# Patient Record
Sex: Female | Born: 1935 | Race: White | Hispanic: No | Smoking: Never smoker
Health system: Southern US, Community
[De-identification: ages and names within clinical notes are randomized; demographics above are authoritative.]

## PROBLEM LIST (undated history)

## (undated) DIAGNOSIS — I10 Essential (primary) hypertension: Secondary | ICD-10-CM

## (undated) DIAGNOSIS — L989 Disorder of the skin and subcutaneous tissue, unspecified: Secondary | ICD-10-CM

## (undated) DIAGNOSIS — F03918 Unspecified dementia, unspecified severity, with other behavioral disturbance: Secondary | ICD-10-CM

## (undated) DIAGNOSIS — I340 Nonrheumatic mitral (valve) insufficiency: Secondary | ICD-10-CM

## (undated) DIAGNOSIS — H00013 Hordeolum externum right eye, unspecified eyelid: Secondary | ICD-10-CM

## (undated) DIAGNOSIS — F418 Other specified anxiety disorders: Secondary | ICD-10-CM

## (undated) DIAGNOSIS — I341 Nonrheumatic mitral (valve) prolapse: Secondary | ICD-10-CM

## (undated) DIAGNOSIS — R443 Hallucinations, unspecified: Secondary | ICD-10-CM

## (undated) DIAGNOSIS — S81801D Unspecified open wound, right lower leg, subsequent encounter: Secondary | ICD-10-CM

## (undated) DIAGNOSIS — R350 Frequency of micturition: Secondary | ICD-10-CM

## (undated) DIAGNOSIS — R519 Headache, unspecified: Secondary | ICD-10-CM

## (undated) DIAGNOSIS — L03116 Cellulitis of left lower limb: Secondary | ICD-10-CM

## (undated) DIAGNOSIS — R51 Headache: Secondary | ICD-10-CM

## (undated) DIAGNOSIS — L509 Urticaria, unspecified: Secondary | ICD-10-CM

## (undated) DIAGNOSIS — L97921 Non-pressure chronic ulcer of unspecified part of left lower leg limited to breakdown of skin: Secondary | ICD-10-CM

## (undated) DIAGNOSIS — R195 Other fecal abnormalities: Secondary | ICD-10-CM

## (undated) DIAGNOSIS — K219 Gastro-esophageal reflux disease without esophagitis: Secondary | ICD-10-CM

## (undated) DIAGNOSIS — S81801S Unspecified open wound, right lower leg, sequela: Secondary | ICD-10-CM

## (undated) DIAGNOSIS — N182 Chronic kidney disease, stage 2 (mild): Secondary | ICD-10-CM

## (undated) DIAGNOSIS — I2510 Atherosclerotic heart disease of native coronary artery without angina pectoris: Secondary | ICD-10-CM

## (undated) DIAGNOSIS — R42 Dizziness and giddiness: Secondary | ICD-10-CM

## (undated) DIAGNOSIS — E785 Hyperlipidemia, unspecified: Secondary | ICD-10-CM

## (undated) DIAGNOSIS — L603 Nail dystrophy: Secondary | ICD-10-CM

## (undated) DIAGNOSIS — L0390 Cellulitis, unspecified: Secondary | ICD-10-CM

## (undated) DIAGNOSIS — F0391 Unspecified dementia with behavioral disturbance: Secondary | ICD-10-CM

## (undated) DIAGNOSIS — M8580 Other specified disorders of bone density and structure, unspecified site: Secondary | ICD-10-CM

## (undated) HISTORY — DX: Chronic kidney disease, stage 2 (mild): N18.2

## (undated) HISTORY — DX: Headache: R51

## (undated) HISTORY — DX: Frequency of micturition: R35.0

## (undated) HISTORY — DX: Unspecified dementia with behavioral disturbance: F03.91

## (undated) HISTORY — DX: Unspecified open wound, right lower leg, sequela: S81.801S

## (undated) HISTORY — PX: APPENDECTOMY: SHX54

## (undated) HISTORY — DX: Unspecified open wound, right lower leg, subsequent encounter: S81.801D

## (undated) HISTORY — DX: Nail dystrophy: L60.3

## (undated) HISTORY — DX: Essential (primary) hypertension: I10

## (undated) HISTORY — DX: Unspecified dementia, unspecified severity, with other behavioral disturbance: F03.918

## (undated) HISTORY — DX: Nonrheumatic mitral (valve) insufficiency: I34.0

## (undated) HISTORY — PX: CHOLECYSTECTOMY: SHX55

## (undated) HISTORY — DX: Gastro-esophageal reflux disease without esophagitis: K21.9

## (undated) HISTORY — DX: Other specified anxiety disorders: F41.8

## (undated) HISTORY — DX: Dizziness and giddiness: R42

## (undated) HISTORY — DX: Hyperlipidemia, unspecified: E78.5

## (undated) HISTORY — DX: Urticaria, unspecified: L50.9

## (undated) HISTORY — DX: Headache, unspecified: R51.9

## (undated) HISTORY — PX: TONSILLECTOMY: SUR1361

## (undated) HISTORY — DX: Non-pressure chronic ulcer of unspecified part of left lower leg limited to breakdown of skin: L97.921

## (undated) HISTORY — PX: CATARACT EXTRACTION, BILATERAL: SHX1313

## (undated) HISTORY — DX: Nonrheumatic mitral (valve) prolapse: I34.1

## (undated) HISTORY — DX: Cellulitis of left lower limb: L03.116

## (undated) HISTORY — DX: Hallucinations, unspecified: R44.3

## (undated) HISTORY — DX: Atherosclerotic heart disease of native coronary artery without angina pectoris: I25.10

## (undated) HISTORY — DX: Other fecal abnormalities: R19.5

## (undated) HISTORY — DX: Hordeolum externum right eye, unspecified eyelid: H00.013

## (undated) HISTORY — DX: Disorder of the skin and subcutaneous tissue, unspecified: L98.9

## (undated) HISTORY — DX: Cellulitis, unspecified: L03.90

## (undated) HISTORY — DX: Other specified disorders of bone density and structure, unspecified site: M85.80

---

## 1997-06-23 ENCOUNTER — Other Ambulatory Visit: Admission: RE | Admit: 1997-06-23 | Discharge: 1997-06-23 | Payer: Self-pay | Admitting: Family Medicine

## 1997-08-22 ENCOUNTER — Other Ambulatory Visit: Admission: RE | Admit: 1997-08-22 | Discharge: 1997-08-22 | Payer: Self-pay | Admitting: Gynecology

## 1998-08-08 ENCOUNTER — Other Ambulatory Visit: Admission: RE | Admit: 1998-08-08 | Discharge: 1998-08-08 | Payer: Self-pay | Admitting: Family Medicine

## 1999-09-04 ENCOUNTER — Other Ambulatory Visit: Admission: RE | Admit: 1999-09-04 | Discharge: 1999-09-04 | Payer: Self-pay | Admitting: Family Medicine

## 1999-09-16 ENCOUNTER — Encounter: Payer: Self-pay | Admitting: Family Medicine

## 1999-09-16 ENCOUNTER — Encounter: Admission: RE | Admit: 1999-09-16 | Discharge: 1999-09-16 | Payer: Self-pay | Admitting: Family Medicine

## 1999-11-21 ENCOUNTER — Encounter: Payer: Self-pay | Admitting: Surgery

## 1999-11-22 ENCOUNTER — Encounter (INDEPENDENT_AMBULATORY_CARE_PROVIDER_SITE_OTHER): Payer: Self-pay | Admitting: Specialist

## 1999-11-22 ENCOUNTER — Observation Stay (HOSPITAL_COMMUNITY): Admission: RE | Admit: 1999-11-22 | Discharge: 1999-11-23 | Payer: Self-pay | Admitting: Surgery

## 2000-01-28 ENCOUNTER — Emergency Department (HOSPITAL_COMMUNITY): Admission: EM | Admit: 2000-01-28 | Discharge: 2000-01-29 | Payer: Self-pay | Admitting: Emergency Medicine

## 2000-01-28 ENCOUNTER — Encounter: Payer: Self-pay | Admitting: Emergency Medicine

## 2000-09-16 ENCOUNTER — Encounter: Payer: Self-pay | Admitting: Family Medicine

## 2000-09-16 ENCOUNTER — Encounter: Admission: RE | Admit: 2000-09-16 | Discharge: 2000-09-16 | Payer: Self-pay | Admitting: Family Medicine

## 2000-10-13 ENCOUNTER — Other Ambulatory Visit: Admission: RE | Admit: 2000-10-13 | Discharge: 2000-10-13 | Payer: Self-pay | Admitting: Family Medicine

## 2000-11-25 ENCOUNTER — Ambulatory Visit (HOSPITAL_COMMUNITY): Admission: RE | Admit: 2000-11-25 | Discharge: 2000-11-25 | Payer: Self-pay | Admitting: Gastroenterology

## 2000-11-25 ENCOUNTER — Encounter (INDEPENDENT_AMBULATORY_CARE_PROVIDER_SITE_OTHER): Payer: Self-pay | Admitting: Specialist

## 2001-08-31 ENCOUNTER — Encounter: Payer: Self-pay | Admitting: Family Medicine

## 2001-08-31 ENCOUNTER — Encounter: Admission: RE | Admit: 2001-08-31 | Discharge: 2001-08-31 | Payer: Self-pay | Admitting: Family Medicine

## 2001-10-07 ENCOUNTER — Other Ambulatory Visit: Admission: RE | Admit: 2001-10-07 | Discharge: 2001-10-07 | Payer: Self-pay | Admitting: Family Medicine

## 2002-06-17 ENCOUNTER — Encounter: Admission: RE | Admit: 2002-06-17 | Discharge: 2002-06-17 | Payer: Self-pay | Admitting: Family Medicine

## 2002-06-17 ENCOUNTER — Encounter: Payer: Self-pay | Admitting: Family Medicine

## 2002-09-07 ENCOUNTER — Encounter: Payer: Self-pay | Admitting: Family Medicine

## 2002-09-07 ENCOUNTER — Encounter: Admission: RE | Admit: 2002-09-07 | Discharge: 2002-09-07 | Payer: Self-pay | Admitting: Family Medicine

## 2003-09-11 ENCOUNTER — Encounter: Admission: RE | Admit: 2003-09-11 | Discharge: 2003-09-11 | Payer: Self-pay | Admitting: Family Medicine

## 2004-07-31 ENCOUNTER — Encounter: Admission: RE | Admit: 2004-07-31 | Discharge: 2004-07-31 | Payer: Self-pay | Admitting: Orthopedic Surgery

## 2004-08-01 ENCOUNTER — Ambulatory Visit (HOSPITAL_BASED_OUTPATIENT_CLINIC_OR_DEPARTMENT_OTHER): Admission: RE | Admit: 2004-08-01 | Discharge: 2004-08-01 | Payer: Self-pay | Admitting: Orthopedic Surgery

## 2004-08-01 ENCOUNTER — Encounter (INDEPENDENT_AMBULATORY_CARE_PROVIDER_SITE_OTHER): Payer: Self-pay | Admitting: *Deleted

## 2004-08-01 ENCOUNTER — Ambulatory Visit (HOSPITAL_COMMUNITY): Admission: RE | Admit: 2004-08-01 | Discharge: 2004-08-01 | Payer: Self-pay | Admitting: Orthopedic Surgery

## 2004-09-03 ENCOUNTER — Other Ambulatory Visit: Admission: RE | Admit: 2004-09-03 | Discharge: 2004-09-03 | Payer: Self-pay | Admitting: Family Medicine

## 2004-09-24 ENCOUNTER — Encounter: Admission: RE | Admit: 2004-09-24 | Discharge: 2004-09-24 | Payer: Self-pay | Admitting: Family Medicine

## 2005-09-30 ENCOUNTER — Encounter: Admission: RE | Admit: 2005-09-30 | Discharge: 2005-09-30 | Payer: Self-pay | Admitting: Family Medicine

## 2005-10-15 ENCOUNTER — Observation Stay (HOSPITAL_COMMUNITY): Admission: EM | Admit: 2005-10-15 | Discharge: 2005-10-16 | Payer: Self-pay | Admitting: Emergency Medicine

## 2005-10-16 HISTORY — PX: CARDIAC CATHETERIZATION: SHX172

## 2006-08-05 ENCOUNTER — Other Ambulatory Visit: Admission: RE | Admit: 2006-08-05 | Discharge: 2006-08-05 | Payer: Self-pay | Admitting: Gynecology

## 2006-09-26 ENCOUNTER — Emergency Department (HOSPITAL_COMMUNITY): Admission: EM | Admit: 2006-09-26 | Discharge: 2006-09-26 | Payer: Self-pay | Admitting: Emergency Medicine

## 2006-10-21 ENCOUNTER — Encounter: Admission: RE | Admit: 2006-10-21 | Discharge: 2006-10-21 | Payer: Self-pay | Admitting: Gynecology

## 2007-05-25 DIAGNOSIS — F418 Other specified anxiety disorders: Secondary | ICD-10-CM

## 2007-05-25 HISTORY — DX: Other specified anxiety disorders: F41.8

## 2007-10-22 ENCOUNTER — Encounter: Admission: RE | Admit: 2007-10-22 | Discharge: 2007-10-22 | Payer: Self-pay | Admitting: Family Medicine

## 2008-06-14 ENCOUNTER — Encounter: Admission: RE | Admit: 2008-06-14 | Discharge: 2008-06-14 | Payer: Self-pay | Admitting: Cardiology

## 2008-06-16 ENCOUNTER — Ambulatory Visit (HOSPITAL_COMMUNITY): Admission: RE | Admit: 2008-06-16 | Discharge: 2008-06-16 | Payer: Self-pay | Admitting: Cardiology

## 2008-06-16 HISTORY — PX: CARDIAC CATHETERIZATION: SHX172

## 2008-10-23 ENCOUNTER — Encounter: Admission: RE | Admit: 2008-10-23 | Discharge: 2008-10-23 | Payer: Self-pay | Admitting: Gynecology

## 2009-10-24 ENCOUNTER — Encounter: Admission: RE | Admit: 2009-10-24 | Discharge: 2009-10-24 | Payer: Self-pay | Admitting: Gynecology

## 2010-01-15 ENCOUNTER — Encounter
Admission: RE | Admit: 2010-01-15 | Discharge: 2010-02-19 | Payer: Self-pay | Source: Home / Self Care | Attending: Orthopedic Surgery | Admitting: Orthopedic Surgery

## 2010-06-04 NOTE — Cardiovascular Report (Signed)
NAMEGENIFER, LAZENBY NO.:  [id number]   MEDICAL RECORD NO.:  [id number]          PATIENT TYPE:  OIB   LOCATION:  2899                         FACILITY:  [facility]   PHYSICIAN:  Thereasa Solo. Little, M.D. DATE OF BIRTH:  06-03-1926   DATE OF PROCEDURE:  06/16/2008  DATE OF DISCHARGE:  06/16/2008                            CARDIAC CATHETERIZATION   INDICATIONS FOR TEST:  This 77 year old female had remotely had a  cardiac catheterization in 2007 that was predominantly normal.  She  presented to my office on Jun 14, 2008 with complaints of some chest  discomfort.  She describes as a pressure sensation and a pounding in her  chest.  She is brought to the Cath Lab for cardiac catheterization for  resolution of this problem.   PROCEDURE:  The patient was prepped and draped in the usual sterile  fashion exposing the right groin.  Following local anesthetic with 1%  Xylocaine the Seldinger technique was employed and a 5-French introducer  sheath was placed in the right femoral artery.  Left and right coronary  arteriography and ventriculography in the RAO projection was performed.   COMPLICATIONS:  None.   MEDICATIONS:  IV Versed 1 mg was given.   TOTAL CONTRAST:  70 mL   RESULTS:  1. Hemodynamic monitoring:  Central aortic pressure was 140/68.  Left      ventricular pressure was 145/2 and there was no significant      gradient across the aortic valve at the time of pullback.  2. Ventriculography.  Ventriculography in the RAO projection revealed      normal to slightly hyperdynamic LV function.  No focal wall motion      abnormalities, mitral valve prolapse with +1 mitral regurgitation      was noted.   CORONARY ARTERIOGRAPHY:  There was faint calcification distribution of  the LAD.  1. Left main normal and bifurcated.  2. Circumflex.  Circumflex was a codominant vessel gave rise to three      OM vessels all of which were free of disease including the ongoing  circumflex.  3. LAD.  The LAD extended down to the apex of the heart.  The mid and      distal vessel and the first diagonal was free of disease.  In the      proximal LAD just past the ostium was an eccentric area of about      50% narrowing.  In 2007, this area was about 30% but has not      significantly changed.  I do not feel that it is hemodynamically      compromising.  4. Right coronary artery codominant vessel was small PDA.  This whole      right system is free of disease.   CONCLUSIONS:  1. Normal LV systolic function.  2. Mitral valve prolapse with some mitral regurgitation.  3. Proximal LAD disease of about 50%.   Based on the cath I cannot explain her pain.  I do plan to place her on  Toprol-XL 25 mg once a day and I will send her home with plans  for  followup in my office next week.  She is already on statin and aspirin  therapy.           ______________________________  Thereasa Solo. Little, M.D.     ABL/MEDQ  D:  06/16/2008  T:  06/16/2008  Job:  [id number]   cc:   Burnell Blanks, MD  Cath Lab

## 2010-06-07 NOTE — Procedures (Signed)
[facility]  Patient:    Julia Henson, Julia Henson Visit Number: [id number] MRN: [id number]          Service Type: END Location: ENDO Attending Physician:  Orland Mustard Dictated by:   Llana Aliment. Randa Evens, M.D. Proc. Date: 11/25/00 Admit Date:  11/25/2000   CC:         Maricela Bo, M.D.                           Procedure Report  PROCEDURE:  Colonoscopy and biopsy.  MEDICATIONS:  Fentanyl 50 mcg, Versed 5 mg IV.  INDICATION:  A nice 77 year old woman who has had diarrhea and irritable bowel, with a strong family history of colon polyps.  This procedure was done to evaluate her colon and to screen for polyps.  DESCRIPTION OF PROCEDURE:  The procedure had been explained to the patient and consent obtained.  With the patient in the left lateral decubitus position, the Olympus pediatric video colonoscope inserted and advanced under direct visualization.  Prep was excellent.  We were able to advance to the cecum without difficulty.  The appendiceal orifice and the ileocecal valve were seen, and the scope withdrawn.  The cecum, ascending colon, hepatic flexure, transverse colon, splenic flexure, descending, and sigmoid colon were seen well with moderate diverticulosis in the sigmoid colon.  No polyps seen.  The mucosa was completely normal throughout.  Several random biopsies were taken to look for microscopic colitis.  The scope was withdrawn.  The patient tolerated the procedure well, maintained on low-flow oxygen and pulse oximeter throughout the procedure.  ASSESSMENT: 1. Diverticulosis with no evidence of diverticulitis. 2. No evidence of polyps. 3. Normal mucosa with no gross colitis.  PLAN:  Will check pathology, see back in the office in six to eight weeks. Recommend repeating colonoscopy in five years due to her family history. Dictated by:   Llana Aliment. Randa Evens, M.D. Attending Physician:  Orland Mustard DD:  11/25/00 TD:   11/26/00 Job: [id number]

## 2010-06-07 NOTE — Cardiovascular Report (Signed)
NAME:  [name], [name] NO.:  [id number]   MEDICAL RECORD NO.:  [id number]          PATIENT TYPE:  OBV   LOCATION:  3734                         FACILITY:  [facility]   PHYSICIAN:  Nicki Guadalajara, M.D.     DATE OF BIRTH:  12/08/1926   DATE OF PROCEDURE:  10/16/2005  DATE OF DISCHARGE:                              CARDIAC CATHETERIZATION   INDICATIONS:  Julia Henson is a very pleasant 77 year old female  patient of Dr. Purnell Shoemaker and Dr. Clarene Duke, who was admitted to [facility]  yesterday, October 15, 2005 with chest pain.  She was seen by Dr. Allyson Sabal.  Symptom complex was worrisome for possible unstable angina.  She is referred  for definitive diagnostic cardiac catheterization.   PROCEDURE:  After premedication with Valium 4 mg intravenously, the patient  prepped and draped in the usual fashion.  Her right femoral artery was  punctured anteriorly, and a 5-French sheath was inserted without difficulty.  Diagnostic catheterization was done utilizing 5-French Judkins for left and  right coronary catheters.  A 5-French pigtail catheter was used for biplane  selective left ventriculography.  With the patient's hypertensive history,  distal aortography was also performed to make certain she did not have any  renal vascular etiology to her hypertension.  She tolerated the procedure  well and returned to her room in satisfactory condition.   HEMODYNAMIC DATA:  Central aortic pressure was 136/62.  Left ventricle  pressure was 136/4, post A-wave 20.   ANGIOGRAPHIC DATA:  Left main coronary artery was angiographically normal  and bifurcated into an LAD and left circumflex system.   There were tandem proximal LAD narrowings of 30% shortly after the ostium,  followed by segmental 20% narrowings proximally prior to the takeoff of the  first septal perforator and first diagonal vessel.  The remainder of the LAD  was free of significant disease and wrapped around the AV  apex.  The LAD  gave rise to one large proximal diagonal vessel and a smaller more distal  diagonal vessel.   The circumflex vessel was angiographically normal and gave rise to two  marginal vessels.   The right coronary was angiographically normal and gave rise to a small PDA  system.   Biplane selective left ventriculography revealed normal global LV  contractility, without focal segmental wall motion abnormalities.  There was  evidence for angiographic mitral valve prolapse, with trace to 1+  angiographic mitral regurgitation.   Distal aortography revealed mild vessel tortuosity, without stenosis.  She  had widely patent renal arteries.  There was no significant aortoiliac  disease.   IMPRESSION:  1. Normal left ventricular function.  2. Angiographic mitral valve prolapse, with trace to 1+ mitral      regurgitation.  3. Mild nonobstructive coronary obstructive disease involving the left      anterior descending artery, with 30% proximal, followed by tandem 10%-      20% narrowings in the proximal left anterior descending.   RECOMMENDATIONS:  Medical therapy.           ______________________________  Nicki Guadalajara, M.D.     TK/MEDQ  D:  10/16/2005  T:  10/17/2005  Job:  [id number]   cc:   Thereasa Solo. Little, M.D.  Lianne Bushy, M.D.

## 2010-06-07 NOTE — Op Note (Signed)
[facility]  Patient:    Julia Henson, Julia Henson                    MRN: [id number] Proc. Date: 11/22/99 Adm. Date:  [id number] Attending:  Charlton Haws CC:         Maricela Bo, M.D.  Thereasa Solo. Little, M.D.   Operative Report  ACCOUNT NUMBER:  [id number]  PREOPERATIVE DIAGNOSIS:  Chronic calculus cholecystitis.  POSTOPERATIVE DIAGNOSIS:  Chronic calculus cholecystitis.  OPERATION:  Laparoscopic cholecystectomy.  SURGEON:  Currie Paris, M.D.  ASSISTANT:  Abigail Miyamoto, M.D.  ANESTHESIA:  General endotracheal.  CLINICAL HISTORY:  This patient is a 77 year old lady who recently presented with biliary-type symptoms and was found to have gallstones on an ultrasound.  DESCRIPTION OF PROCEDURE:  The patient was brought to the operating room and after satisfactory general endotracheal anesthesia had been obtained the abdomen was prepped and draped.  I used 0.25% Marcaine for each incision.  The umbilical incision was made.  The fascia was picked up and opened.  The peritoneal cavity was entered under direct vision.  A pursestring was placed. The Hasson was introduced.  A 360-degree evaluation of the abdomen showed no gross abnormalities.  The patient was then placed in reverse Trendelenburg and three additional cannulas were placed in the usual positions.  The gallbladder had very few adhesions on it and I was able to begin to section the triangle of Calot, identify the cystic duct and its junction with the gallbladder, and open up into the triangle of Calot a little bit more by opening the peritoneum on either side to see the cystic artery and that there were no other structures in the triangle of Calot.  The cystic duct was clipped twice on the stay side and once on the go side and divided.  The cystic artery was dissected out a little bit better, clipped twice on the stay side and once on the go side and divided.  The  gallbladder was removed from below to above with the coagulation current of the cautery.  Just prior to disconnecting the gallbladder, we irrigated and made sure that everything looked dry, then disconnected it, and brought it out the umbilical port.  The abdomen was reinflated, checked for hemostasis, and final irrigation made. Everything looked okay.  The lateral ports were removed and there was no bleeding from them.  The umbilical port was closed with a pursestring and checked with a camera in the epigastric port.  The abdomen was deflated through the epigastric port.  All skin incisions were closed with Monocryl and Steri-Strips.  The patient tolerated the procedure well.  There were no operative complications.  All counts were correct. DD:  11/22/99 TD:  11/22/99 Job: [id number]

## 2010-06-07 NOTE — Op Note (Signed)
[name], Julia Henson            ACCOUNT NO.:  [id number]   MEDICAL RECORD NO.:  [id number]          PATIENT TYPE:  AMB   LOCATION:  DSC                          FACILITY:  [facility]   PHYSICIAN:  John L. Rendall, M.D.  DATE OF BIRTH:  December 19, 1926   DATE OF PROCEDURE:  08/01/2004  DATE OF DISCHARGE:                                 OPERATIVE REPORT   INDICATION AND JUSTIFICATION FOR PROCEDURE:  History of right ankle being  punctured by a stick six months ago with intermittent infection and drainage  and palpable, firm and tender area adjacent to the puncture.   JUSTIFICATION FOR OUTPATIENT SETTING:  Inpatient not required.   PREOPERATIVE DIAGNOSIS:  Probable foreign body, right ankle.   SURGICAL PROCEDURES:  Exploration of ankle down to fascia with excision of  foreign body granuloma.   POSTOPERATIVE DIAGNOSIS:  Foreign body granuloma.   SURGEON:  John L. Rendall, M.D.   ANESTHESIA:  General.   DESCRIPTION OF PROCEDURE:  Under brief general anesthetic, the right foot  and ankle were prepared with DuraPrep and draped as a sterile field. An  ankle Esmarch was used. A 1-inch incision was made between the original  puncture site and an I&D site. In this area, a firm, hard cord had been  palpated. Upon opening the skin and subcutaneous, the firm, hard cord  appeared to be firm, hard fibrous tissue with specks of black dirt on it.  This area was resected and removed. A splinter was not palpable. Further  resection of more hard granulomatous tissue was done in the more posterior  aspect and upon completion no splinter was found, but significant granuloma  tissue was removed. The wound was then closed with 4-0 nylon suture,  infiltrated with Marcaine 0.5% and a sterile compression bandage was  applied. The patient returned to recovery in good condition.       JLR/MEDQ  D:  08/01/2004  T:  08/01/2004  Job:  [id number]

## 2010-09-27 ENCOUNTER — Other Ambulatory Visit: Payer: Self-pay | Admitting: Gynecology

## 2010-09-27 DIAGNOSIS — Z1231 Encounter for screening mammogram for malignant neoplasm of breast: Secondary | ICD-10-CM

## 2010-10-28 ENCOUNTER — Ambulatory Visit
Admission: RE | Admit: 2010-10-28 | Discharge: 2010-10-28 | Disposition: A | Discharge status: Home / Self Care | Payer: Medicare Other | Source: Ambulatory Visit | Attending: Gynecology | Admitting: Gynecology

## 2010-10-28 DIAGNOSIS — Z1231 Encounter for screening mammogram for malignant neoplasm of breast: Secondary | ICD-10-CM

## 2011-02-21 HISTORY — PX: NM MYOCAR PERF WALL MOTION: HXRAD629

## 2011-09-25 ENCOUNTER — Other Ambulatory Visit: Payer: Self-pay | Admitting: Gynecology

## 2011-09-25 DIAGNOSIS — Z1231 Encounter for screening mammogram for malignant neoplasm of breast: Secondary | ICD-10-CM

## 2011-10-29 ENCOUNTER — Ambulatory Visit
Admission: RE | Admit: 2011-10-29 | Discharge: 2011-10-29 | Disposition: A | Discharge status: Home / Self Care | Payer: Medicare Other | Source: Ambulatory Visit | Attending: Gynecology | Admitting: Gynecology

## 2011-10-29 DIAGNOSIS — Z1231 Encounter for screening mammogram for malignant neoplasm of breast: Secondary | ICD-10-CM

## 2012-09-22 ENCOUNTER — Other Ambulatory Visit: Payer: Self-pay | Admitting: Family Medicine

## 2012-09-22 DIAGNOSIS — E2839 Other primary ovarian failure: Secondary | ICD-10-CM

## 2012-09-22 DIAGNOSIS — Z1231 Encounter for screening mammogram for malignant neoplasm of breast: Secondary | ICD-10-CM

## 2012-10-29 ENCOUNTER — Ambulatory Visit
Admission: RE | Admit: 2012-10-29 | Discharge: 2012-10-29 | Disposition: A | Discharge status: Home / Self Care | Payer: Medicare Other | Source: Ambulatory Visit | Attending: Family Medicine | Admitting: Family Medicine

## 2012-10-29 DIAGNOSIS — E2839 Other primary ovarian failure: Secondary | ICD-10-CM

## 2012-10-29 DIAGNOSIS — Z1231 Encounter for screening mammogram for malignant neoplasm of breast: Secondary | ICD-10-CM

## 2012-10-29 LAB — HM DEXA SCAN: HM DEXA SCAN: ABNORMAL

## 2013-02-16 ENCOUNTER — Encounter: Payer: Self-pay | Admitting: *Deleted

## 2013-02-24 ENCOUNTER — Encounter: Payer: Self-pay | Admitting: Internal Medicine

## 2013-02-25 ENCOUNTER — Encounter: Payer: Self-pay | Admitting: Internal Medicine

## 2013-02-25 ENCOUNTER — Ambulatory Visit (INDEPENDENT_AMBULATORY_CARE_PROVIDER_SITE_OTHER): Payer: Medicare Other | Admitting: Internal Medicine

## 2013-02-25 VITALS — BP 110/82 | HR 73 | Ht 66.0 in | Wt 130.7 lb

## 2013-02-25 DIAGNOSIS — E785 Hyperlipidemia, unspecified: Secondary | ICD-10-CM

## 2013-02-25 DIAGNOSIS — I2510 Atherosclerotic heart disease of native coronary artery without angina pectoris: Secondary | ICD-10-CM

## 2013-02-25 DIAGNOSIS — I10 Essential (primary) hypertension: Secondary | ICD-10-CM

## 2013-02-25 NOTE — Patient Instructions (Signed)
Follow-up annually

## 2013-02-28 ENCOUNTER — Encounter: Payer: Self-pay | Admitting: Internal Medicine

## 2013-02-28 DIAGNOSIS — I2510 Atherosclerotic heart disease of native coronary artery without angina pectoris: Secondary | ICD-10-CM | POA: Insufficient documentation

## 2013-02-28 DIAGNOSIS — I10 Essential (primary) hypertension: Secondary | ICD-10-CM | POA: Insufficient documentation

## 2013-02-28 DIAGNOSIS — E785 Hyperlipidemia, unspecified: Secondary | ICD-10-CM | POA: Insufficient documentation

## 2013-02-28 NOTE — Progress Notes (Signed)
OFFICE NOTE  Chief Complaint:  No complaints  Primary Care Physician: Ailene RavelHAMRICK,MAURA L, MD  HPI:  Julia Henson is a pleasant 80 year old female who unfortunately lost her husband around this time last year. He apparently had had a double pneumonia and was at home and died fairly suddenly. She was often seen by Dr. Clarene DukeLittle prior to seeing me with her husband in the office and he had been fairly sick recently. She continues to be sad about his passing. She denies any chest pain, shortness of breath, or other cardiac symptoms.  She had mild CAD by cath in 2010, but a negative NST in 2013.  PMHx:  Past Medical History  Diagnosis Date  . CAD (coronary artery disease)     mild by cath in 2010  . Hypertension   . Dyslipidemia   . MVP (mitral valve prolapse)     Past Surgical History  Procedure Laterality Date  . Cardiac catheterization  06/16/2008    normal LV systolic function, MVP with some mild MR, prox LAD disease (50%) - Dr. Mervyn SkeetersA. Little  . Cardiac catheterization  10/16/2005    normal LV systolic function, MVP with trace to 1+ MR, mild nonobstructive CAD involving LAD 30% prox, 10-20 narrowing in prox LAD (Dr. Bishop Limbo. Kelly)  . Nm myocar perf wall motion  02/2011    lexiscan myoview; perfusion dfect of 5% of LV myocardium, small area of anteroseptal breast attenuation artifact, no reversible ischemia, EF 81%, abnormal but low risk scan    FAMHx:  Family History  Problem Relation Age of Onset  . Heart attack Mother   . Pneumonia Father     SOCHx:   reports that she has never smoked. She has never used smokeless tobacco. She reports that she does not drink alcohol. Her drug history is not on file.  ALLERGIES:  Allergies  Allergen Reactions  . Corn-Containing Products   . Other     grapes  . Phenobarbital   . Prilosec [Omeprazole]   . Sulfa Antibiotics     ROS: A comprehensive review of systems was negative.  HOME MEDS: Current Outpatient Prescriptions  Medication  Sig Dispense Refill  . ALPRAZolam (XANAX) 0.5 MG tablet Take 0.5 mg by mouth daily as needed for anxiety.      . Artificial Tear Ointment (ARTIFICIAL TEARS) ointment Place into both eyes as needed.      Marland Kitchen. aspirin 81 MG tablet Take 81 mg by mouth daily.      . calcium-vitamin D 250-100 MG-UNIT per tablet Take 1 tablet by mouth daily.      . Chlorpheniramine Maleate (ALLERGY PO) Take by mouth daily as needed.      . Cholecalciferol (VITAMIN D3) 2000 UNITS capsule Take 2,000 Units by mouth daily.      . Coenzyme Q10 (CO Q-10 PO) Take by mouth daily.      . fenofibrate micronized (LOFIBRA) 134 MG capsule Take 134 mg by mouth daily before breakfast.      . GuaiFENesin (MUCINEX PO) Take by mouth as needed.      . metoprolol succinate (TOPROL-XL) 25 MG 24 hr tablet Take 12.5 mg by mouth daily.      . Multiple Vitamins-Minerals (EQL PROTECTAVISION PO) Take by mouth daily.      . Omega-3 Fatty Acids (FISH OIL) 1200 MG CAPS Take by mouth daily.      . pravastatin (PRAVACHOL) 40 MG tablet Take 40 mg by mouth daily.      .Marland Kitchen  meclizine (ANTIVERT) 25 MG tablet Take 1 tablet by mouth as needed.      . ranitidine (ZANTAC) 150 MG tablet Take 1 tablet by mouth daily.       No current facility-administered medications for this visit.    LABS/IMAGING: No results found for this or any previous visit (from the past 48 hour(s)). No results found.  VITALS: BP 110/82  Pulse 73  Ht 5\' 6"  (1.676 m)  Wt 130 lb 11.2 oz (59.285 kg)  BMI 21.11 kg/m2  EXAM: General appearance: alert and no distress Neck: no carotid bruit and no JVD Lungs: clear to auscultation bilaterally Heart: regular rate and rhythm, S1, S2 normal, no murmur, click, rub or gallop Abdomen: soft, non-tender; bowel sounds normal; no masses,  no organomegaly Extremities: extremities normal, atraumatic, no cyanosis or edema Pulses: 2+ and symmetric Skin: Skin color, texture, turgor normal. No rashes or lesions Neurologic: Grossly normal Psych:  Sad  EKG: Normal sinus rhythm at 73, RBBB  ASSESSMENT: 1. Hypertension-controlled 2. Dyslipidemia-on pravastatin 3. Mild coronary disease 4. RBBB - stable  PLAN: 1.   Julia Henson is doing well with the control blood pressure and cholesterol which is the goal. She's had no chest pain or other symptoms of coronary disease. She remains active. She's not had worsening shortness of breath or fatigue. She has a stable right bundle branch block it is unchanged from her prior EKG.  I recommended continuing her current therapy we'll plan to see her back annually.  Chrystie Nose, MD, [facility] Attending Cardiologist [facility]  Quintella Mura C 02/28/2013, 6:57 PM

## 2013-11-18 ENCOUNTER — Other Ambulatory Visit: Payer: Self-pay

## 2013-11-18 DIAGNOSIS — Z1231 Encounter for screening mammogram for malignant neoplasm of breast: Secondary | ICD-10-CM

## 2013-12-19 ENCOUNTER — Ambulatory Visit: Payer: Medicare Other

## 2013-12-19 ENCOUNTER — Ambulatory Visit
Admission: RE | Admit: 2013-12-19 | Discharge: 2013-12-19 | Disposition: A | Discharge status: Home / Self Care | Payer: Medicare Other | Source: Ambulatory Visit

## 2013-12-19 DIAGNOSIS — Z1231 Encounter for screening mammogram for malignant neoplasm of breast: Secondary | ICD-10-CM

## 2014-01-30 DIAGNOSIS — N183 Chronic kidney disease, stage 3 (moderate): Secondary | ICD-10-CM | POA: Diagnosis not present

## 2014-01-30 DIAGNOSIS — E780 Pure hypercholesterolemia: Secondary | ICD-10-CM | POA: Diagnosis not present

## 2014-02-01 DIAGNOSIS — K219 Gastro-esophageal reflux disease without esophagitis: Secondary | ICD-10-CM | POA: Diagnosis not present

## 2014-02-01 DIAGNOSIS — I10 Essential (primary) hypertension: Secondary | ICD-10-CM | POA: Diagnosis not present

## 2014-02-01 DIAGNOSIS — Z23 Encounter for immunization: Secondary | ICD-10-CM | POA: Diagnosis not present

## 2014-02-01 DIAGNOSIS — N183 Chronic kidney disease, stage 3 (moderate): Secondary | ICD-10-CM | POA: Diagnosis not present

## 2014-02-01 DIAGNOSIS — E780 Pure hypercholesterolemia: Secondary | ICD-10-CM | POA: Diagnosis not present

## 2014-02-27 ENCOUNTER — Ambulatory Visit (INDEPENDENT_AMBULATORY_CARE_PROVIDER_SITE_OTHER): Payer: Medicare Other | Admitting: Internal Medicine

## 2014-02-27 ENCOUNTER — Encounter: Payer: Self-pay | Admitting: Internal Medicine

## 2014-02-27 VITALS — BP 124/76 | HR 71 | Ht 65.5 in | Wt 134.1 lb

## 2014-02-27 DIAGNOSIS — I2510 Atherosclerotic heart disease of native coronary artery without angina pectoris: Secondary | ICD-10-CM | POA: Diagnosis not present

## 2014-02-27 DIAGNOSIS — I10 Essential (primary) hypertension: Secondary | ICD-10-CM | POA: Diagnosis not present

## 2014-02-27 DIAGNOSIS — E785 Hyperlipidemia, unspecified: Secondary | ICD-10-CM

## 2014-02-27 DIAGNOSIS — I2583 Coronary atherosclerosis due to lipid rich plaque: Secondary | ICD-10-CM

## 2014-02-27 NOTE — Patient Instructions (Signed)
Please have fasting lab work at [facility].   Your physician wants you to follow-up in: 1 year with Dr. Rennis GoldenHilty. You will receive a reminder letter in the mail two months in advance. If you don't receive a letter, please call our office to schedule the follow-up appointment.

## 2014-02-28 NOTE — Progress Notes (Signed)
OFFICE NOTE  Chief Complaint:  No complaints  Primary Care Physician: Julia Henson,Julia L, MD  HPI:  Julia GustinLillian C Henson is a pleasant 81 year old female who unfortunately lost her husband around this time last year. He apparently had had a double pneumonia and was at home and died fairly suddenly. She was often seen by Dr. Clarene DukeLittle prior to seeing me with her husband in the office and he had been fairly sick recently. She continues to be sad about his passing. She denies any chest pain, shortness of breath, or other cardiac symptoms.  She had mild CAD by cath in 2010, but a negative NST in 2013.  I saw Julia RamusLillian back in the office today. She is without any complaints other than some very occasional indigestion. She denies any chest pain or worsening shortness of breath. She remains active. She's not had a recent cholesterol assessment.  PMHx:  Past Medical History  Diagnosis Date  . CAD (coronary artery disease)     mild by cath in 2010  . Hypertension   . Dyslipidemia   . MVP (mitral valve prolapse)     Past Surgical History  Procedure Laterality Date  . Cardiac catheterization  06/16/2008    normal LV systolic function, MVP with some mild MR, prox LAD disease (50%) - Dr. Mervyn SkeetersA. Little  . Cardiac catheterization  10/16/2005    normal LV systolic function, MVP with trace to 1+ MR, mild nonobstructive CAD involving LAD 30% prox, 10-20 narrowing in prox LAD (Dr. Bishop Limbo. Kelly)  . Nm myocar perf wall motion  02/2011    lexiscan myoview; perfusion dfect of 5% of LV myocardium, small area of anteroseptal breast attenuation artifact, no reversible ischemia, EF 81%, abnormal but low risk scan    FAMHx:  Family History  Problem Relation Age of Onset  . Heart attack Mother   . Pneumonia Father     SOCHx:   reports that she has never smoked. She has never used smokeless tobacco. She reports that she does not drink alcohol. Her drug history is not on file.  ALLERGIES:  Allergies  Allergen  Reactions  . Corn-Containing Products   . Other     grapes  . Phenobarbital   . Prilosec [Omeprazole]   . Sulfa Antibiotics     ROS: A comprehensive review of systems was negative except for: Gastrointestinal: positive for reflux symptoms  HOME MEDS: Current Outpatient Prescriptions  Medication Sig Dispense Refill  . ALPRAZolam (XANAX) 0.5 MG tablet Take 0.5 mg by mouth daily as needed for anxiety.    . Artificial Tear Ointment (ARTIFICIAL TEARS) ointment Place into both eyes as needed.    Marland Kitchen. aspirin 81 MG tablet Take 81 mg by mouth daily.    . calcium-vitamin D 250-100 MG-UNIT per tablet Take 1 tablet by mouth daily.    . Chlorpheniramine Maleate (ALLERGY PO) Take by mouth daily as needed.    . Cholecalciferol (VITAMIN D3) 2000 UNITS capsule Take 2,000 Units by mouth daily.    . Coenzyme Q10 (CO Q-10 PO) Take by mouth daily.    . fenofibrate micronized (LOFIBRA) 134 MG capsule Take 134 mg by mouth daily before breakfast.    . GuaiFENesin (MUCINEX PO) Take by mouth as needed.    . meclizine (ANTIVERT) 25 MG tablet Take 1 tablet by mouth as needed.    . metoprolol succinate (TOPROL-XL) 25 MG 24 hr tablet Take 12.5 mg by mouth daily.    . Multiple Vitamins-Minerals (EQL PROTECTAVISION PO) Take  by mouth daily.    . Omega-3 Fatty Acids (FISH OIL) 1200 MG CAPS Take by mouth daily.    . pravastatin (PRAVACHOL) 40 MG tablet Take 40 mg by mouth daily.    . ranitidine (ZANTAC) 150 MG tablet Take 1 tablet by mouth daily.     No current facility-administered medications for this visit.    LABS/IMAGING: No results found for this or any previous visit (from the past 48 hour(s)). No results found.  VITALS: BP 124/76 mmHg  Pulse 71  Ht 5' 5.5" (1.664 m)  Wt 134 lb 1.6 oz (60.827 kg)  BMI 21.97 kg/m2  EXAM: General appearance: alert and no distress Neck: no carotid bruit and no JVD Lungs: clear to auscultation bilaterally Heart: regular rate and rhythm, S1, S2 normal, no murmur,  click, rub or gallop Abdomen: soft, non-tender; bowel sounds normal; no masses,  no organomegaly Extremities: extremities normal, atraumatic, no cyanosis or edema Pulses: 2+ and symmetric Skin: Skin color, texture, turgor normal. No rashes or lesions Neurologic: Grossly normal Psych: Sad  EKG: Normal sinus rhythm at 71, RBBB, LAFB  ASSESSMENT: 1. Hypertension-controlled 2. Dyslipidemia-on pravastatin 3. Mild coronary disease 4. RBBB - stable  PLAN: 1.   Ms. [name] is doing well with the control blood pressure and cholesterol which is the goal. She's had no chest pain or other symptoms of coronary disease. She remains active. She's not had worsening shortness of breath or fatigue. She has a stable right bundle branch block it is unchanged from her prior EKG.  I recommend we check a lipid profile and adjust her medications accordingly. Otherwise plan to see her back annually or sooner as necessary.  Chrystie Nose, MD, [facility] Attending Cardiologist [facility]  HILTY,Kenneth C 02/28/2014, 8:14 AM

## 2014-03-02 DIAGNOSIS — S0990XA Unspecified injury of head, initial encounter: Secondary | ICD-10-CM | POA: Diagnosis not present

## 2014-03-02 DIAGNOSIS — Z9181 History of falling: Secondary | ICD-10-CM | POA: Diagnosis not present

## 2014-03-02 DIAGNOSIS — R42 Dizziness and giddiness: Secondary | ICD-10-CM | POA: Diagnosis not present

## 2014-03-03 ENCOUNTER — Ambulatory Visit
Admission: RE | Admit: 2014-03-03 | Discharge: 2014-03-03 | Disposition: A | Discharge status: Home / Self Care | Payer: Medicare Other | Source: Ambulatory Visit | Attending: Family Medicine | Admitting: Family Medicine

## 2014-03-03 ENCOUNTER — Other Ambulatory Visit: Payer: Self-pay | Admitting: Family Medicine

## 2014-03-03 DIAGNOSIS — R42 Dizziness and giddiness: Secondary | ICD-10-CM | POA: Diagnosis not present

## 2014-03-03 DIAGNOSIS — R410 Disorientation, unspecified: Secondary | ICD-10-CM | POA: Diagnosis not present

## 2014-03-03 DIAGNOSIS — I639 Cerebral infarction, unspecified: Secondary | ICD-10-CM | POA: Diagnosis not present

## 2014-03-03 DIAGNOSIS — S0990XA Unspecified injury of head, initial encounter: Secondary | ICD-10-CM | POA: Diagnosis not present

## 2014-03-03 DIAGNOSIS — W19XXXA Unspecified fall, initial encounter: Secondary | ICD-10-CM

## 2014-03-03 MED ORDER — IOHEXOL 300 MG/ML  SOLN: 100.0000 mL | Freq: Once | INTRAMUSCULAR | Status: DC | PRN

## 2014-03-07 DIAGNOSIS — Z9181 History of falling: Secondary | ICD-10-CM | POA: Diagnosis not present

## 2014-03-07 DIAGNOSIS — R42 Dizziness and giddiness: Secondary | ICD-10-CM | POA: Diagnosis not present

## 2014-03-07 DIAGNOSIS — S0990XA Unspecified injury of head, initial encounter: Secondary | ICD-10-CM | POA: Diagnosis not present

## 2014-03-07 DIAGNOSIS — R413 Other amnesia: Secondary | ICD-10-CM | POA: Diagnosis not present

## 2014-04-06 DIAGNOSIS — S0093XA Contusion of unspecified part of head, initial encounter: Secondary | ICD-10-CM | POA: Diagnosis not present

## 2014-04-06 DIAGNOSIS — F418 Other specified anxiety disorders: Secondary | ICD-10-CM | POA: Diagnosis not present

## 2014-04-06 DIAGNOSIS — R413 Other amnesia: Secondary | ICD-10-CM | POA: Diagnosis not present

## 2014-08-07 ENCOUNTER — Other Ambulatory Visit: Payer: Self-pay | Admitting: Nurse Practitioner

## 2014-08-07 ENCOUNTER — Ambulatory Visit
Admission: RE | Admit: 2014-08-07 | Discharge: 2014-08-07 | Disposition: A | Discharge status: Home / Self Care | Payer: Medicare Other | Source: Ambulatory Visit | Attending: Nurse Practitioner | Admitting: Nurse Practitioner

## 2014-08-07 DIAGNOSIS — M549 Dorsalgia, unspecified: Secondary | ICD-10-CM

## 2014-08-07 DIAGNOSIS — N183 Chronic kidney disease, stage 3 (moderate): Secondary | ICD-10-CM | POA: Diagnosis not present

## 2014-08-07 DIAGNOSIS — Z6820 Body mass index (BMI) 20.0-20.9, adult: Secondary | ICD-10-CM | POA: Diagnosis not present

## 2014-08-07 DIAGNOSIS — M546 Pain in thoracic spine: Secondary | ICD-10-CM | POA: Diagnosis not present

## 2014-08-07 DIAGNOSIS — Z9181 History of falling: Secondary | ICD-10-CM | POA: Diagnosis not present

## 2014-08-07 DIAGNOSIS — M4155 Other secondary scoliosis, thoracolumbar region: Secondary | ICD-10-CM | POA: Diagnosis not present

## 2014-08-07 DIAGNOSIS — E780 Pure hypercholesterolemia: Secondary | ICD-10-CM | POA: Diagnosis not present

## 2014-08-07 DIAGNOSIS — M4156 Other secondary scoliosis, lumbar region: Secondary | ICD-10-CM | POA: Diagnosis not present

## 2014-08-14 DIAGNOSIS — Z9181 History of falling: Secondary | ICD-10-CM | POA: Diagnosis not present

## 2014-08-14 DIAGNOSIS — I10 Essential (primary) hypertension: Secondary | ICD-10-CM | POA: Diagnosis not present

## 2014-08-14 DIAGNOSIS — E780 Pure hypercholesterolemia: Secondary | ICD-10-CM | POA: Diagnosis not present

## 2014-08-14 DIAGNOSIS — R413 Other amnesia: Secondary | ICD-10-CM | POA: Diagnosis not present

## 2014-08-14 DIAGNOSIS — Z139 Encounter for screening, unspecified: Secondary | ICD-10-CM | POA: Diagnosis not present

## 2014-08-14 DIAGNOSIS — N182 Chronic kidney disease, stage 2 (mild): Secondary | ICD-10-CM | POA: Diagnosis not present

## 2014-08-29 DIAGNOSIS — M9903 Segmental and somatic dysfunction of lumbar region: Secondary | ICD-10-CM | POA: Diagnosis not present

## 2014-08-29 DIAGNOSIS — M5416 Radiculopathy, lumbar region: Secondary | ICD-10-CM | POA: Diagnosis not present

## 2014-08-31 DIAGNOSIS — M9903 Segmental and somatic dysfunction of lumbar region: Secondary | ICD-10-CM | POA: Diagnosis not present

## 2014-08-31 DIAGNOSIS — M5416 Radiculopathy, lumbar region: Secondary | ICD-10-CM | POA: Diagnosis not present

## 2014-09-04 DIAGNOSIS — M5416 Radiculopathy, lumbar region: Secondary | ICD-10-CM | POA: Diagnosis not present

## 2014-09-04 DIAGNOSIS — M9903 Segmental and somatic dysfunction of lumbar region: Secondary | ICD-10-CM | POA: Diagnosis not present

## 2014-09-07 DIAGNOSIS — M5416 Radiculopathy, lumbar region: Secondary | ICD-10-CM | POA: Diagnosis not present

## 2014-09-07 DIAGNOSIS — M9903 Segmental and somatic dysfunction of lumbar region: Secondary | ICD-10-CM | POA: Diagnosis not present

## 2014-09-11 DIAGNOSIS — M9903 Segmental and somatic dysfunction of lumbar region: Secondary | ICD-10-CM | POA: Diagnosis not present

## 2014-09-11 DIAGNOSIS — M5416 Radiculopathy, lumbar region: Secondary | ICD-10-CM | POA: Diagnosis not present

## 2014-09-21 DIAGNOSIS — M9903 Segmental and somatic dysfunction of lumbar region: Secondary | ICD-10-CM | POA: Diagnosis not present

## 2014-09-21 DIAGNOSIS — M5416 Radiculopathy, lumbar region: Secondary | ICD-10-CM | POA: Diagnosis not present

## 2014-09-27 DIAGNOSIS — M5416 Radiculopathy, lumbar region: Secondary | ICD-10-CM | POA: Diagnosis not present

## 2014-09-27 DIAGNOSIS — M9903 Segmental and somatic dysfunction of lumbar region: Secondary | ICD-10-CM | POA: Diagnosis not present

## 2014-10-03 DIAGNOSIS — M9903 Segmental and somatic dysfunction of lumbar region: Secondary | ICD-10-CM | POA: Diagnosis not present

## 2014-10-03 DIAGNOSIS — M5416 Radiculopathy, lumbar region: Secondary | ICD-10-CM | POA: Diagnosis not present

## 2014-11-10 DIAGNOSIS — Z681 Body mass index (BMI) 19 or less, adult: Secondary | ICD-10-CM | POA: Diagnosis not present

## 2014-11-10 DIAGNOSIS — S81801A Unspecified open wound, right lower leg, initial encounter: Secondary | ICD-10-CM | POA: Diagnosis not present

## 2014-11-22 ENCOUNTER — Other Ambulatory Visit: Payer: Self-pay

## 2014-11-22 DIAGNOSIS — Z1231 Encounter for screening mammogram for malignant neoplasm of breast: Secondary | ICD-10-CM

## 2014-12-29 ENCOUNTER — Ambulatory Visit
Admission: RE | Admit: 2014-12-29 | Discharge: 2014-12-29 | Disposition: A | Discharge status: Home / Self Care | Payer: Medicare Other | Source: Ambulatory Visit

## 2014-12-29 DIAGNOSIS — Z1231 Encounter for screening mammogram for malignant neoplasm of breast: Secondary | ICD-10-CM | POA: Diagnosis not present

## 2015-02-14 DIAGNOSIS — E7800 Pure hypercholesterolemia, unspecified: Secondary | ICD-10-CM | POA: Diagnosis not present

## 2015-02-14 DIAGNOSIS — N182 Chronic kidney disease, stage 2 (mild): Secondary | ICD-10-CM | POA: Diagnosis not present

## 2015-02-16 DIAGNOSIS — N182 Chronic kidney disease, stage 2 (mild): Secondary | ICD-10-CM | POA: Diagnosis not present

## 2015-02-16 DIAGNOSIS — Z23 Encounter for immunization: Secondary | ICD-10-CM | POA: Diagnosis not present

## 2015-02-16 DIAGNOSIS — Z681 Body mass index (BMI) 19 or less, adult: Secondary | ICD-10-CM | POA: Diagnosis not present

## 2015-02-16 DIAGNOSIS — I10 Essential (primary) hypertension: Secondary | ICD-10-CM | POA: Diagnosis not present

## 2015-02-16 DIAGNOSIS — R413 Other amnesia: Secondary | ICD-10-CM | POA: Diagnosis not present

## 2015-02-16 DIAGNOSIS — E7800 Pure hypercholesterolemia, unspecified: Secondary | ICD-10-CM | POA: Diagnosis not present

## 2015-02-27 ENCOUNTER — Ambulatory Visit: Payer: Self-pay | Admitting: Internal Medicine

## 2015-02-28 ENCOUNTER — Encounter: Payer: Self-pay | Admitting: *Deleted

## 2015-03-06 DIAGNOSIS — Z139 Encounter for screening, unspecified: Secondary | ICD-10-CM

## 2015-03-08 NOTE — Congregational Nurse Program (Signed)
Congregational Nurse Program Note  Date of Encounter: 03/06/2015  Past Medical History: Past Medical History  Diagnosis Date  . CAD (coronary artery disease)     mild by cath in 2010  . Hypertension   . Dyslipidemia   . MVP (mitral valve prolapse)     Encounter Details:     CNP Questionnaire - 03/06/15 1201    Patient Demographics   Is this a new or existing patient? Existing   Patient is considered a/an Not Applicable   Race Caucasian/White   Patient Assistance   Location of Patient Assistance Not Applicable   Patient's financial/insurance status Medicare   Uninsured Patient No   Patient referred to apply for the following financial assistance Not Applicable   Food insecurities addressed Not Applicable   Transportation assistance No   Assistance securing medications No   Educational health offerings Acute disease   Encounter Details   Primary purpose of visit Education/Health Concerns   Was an Emergency Department visit averted? Not Applicable   Does patient have a medical provider? Yes   Patient referred to Not Applicable   Was a mental health screening completed? (GAINS tool) No   Does patient have dental issues? No   Does patient have vision issues? No   Since previous encounter, have you referred patient for abnormal blood pressure that resulted in a new diagnosis or medication change? No   Since previous encounter, have you referred patient for abnormal blood glucose that resulted in a new diagnosis or medication change? No   For Abstraction Use Only   Does patient have insurance? Yes         Amb Nursing Assessment - 03/08/15 1753    Pain Assessment   Pain Assessment No/denies pain   Comments   Comments no rash at this time     The assist director of the [facility] informed me that Ms. O'Briant had a reaction and rash from eating either raisins or corn last week. i check with Ms. O'Briant and rash was gone. I noticed she was eating a snack with  raisins in it and she stated"I am taking them out and I will be okay." No rash noted by the time she left the center which was 2 hours later. Garvin Fila RN, FCN

## 2015-04-10 ENCOUNTER — Ambulatory Visit (INDEPENDENT_AMBULATORY_CARE_PROVIDER_SITE_OTHER): Payer: PPO | Admitting: Internal Medicine

## 2015-04-10 ENCOUNTER — Encounter: Payer: Self-pay | Admitting: Internal Medicine

## 2015-04-10 VITALS — BP 120/76 | HR 62 | Ht 65.0 in | Wt 113.3 lb

## 2015-04-10 DIAGNOSIS — I10 Essential (primary) hypertension: Secondary | ICD-10-CM

## 2015-04-10 DIAGNOSIS — I2510 Atherosclerotic heart disease of native coronary artery without angina pectoris: Secondary | ICD-10-CM

## 2015-04-10 DIAGNOSIS — E785 Hyperlipidemia, unspecified: Secondary | ICD-10-CM | POA: Diagnosis not present

## 2015-04-10 DIAGNOSIS — I2583 Coronary atherosclerosis due to lipid rich plaque: Secondary | ICD-10-CM

## 2015-04-10 DIAGNOSIS — Z79899 Other long term (current) drug therapy: Secondary | ICD-10-CM

## 2015-04-10 NOTE — Progress Notes (Signed)
OFFICE NOTE  Chief Complaint:  Routine follow-up  Primary Care Physician: Ailene RavelHAMRICK,MAURA L, MD  HPI:  Julia Henson is a pleasant 82 year old female who unfortunately lost her husband around this time last year. He apparently had had a double pneumonia and was at home and died fairly suddenly. She was often seen by Dr. Clarene DukeLittle prior to seeing me with her husband in the office and he had been fairly sick recently. She continues to be sad about his passing. She denies any chest pain, shortness of breath, or other cardiac symptoms.  She had mild CAD by cath in 2010, but a negative NST in 2013.  I saw Julia Henson back in the office today. She is without any complaints other than some very occasional indigestion. She denies any chest pain or worsening shortness of breath. She remains active. She's not had a recent cholesterol assessment.  Julia Henson returns for follow-up today in the office. She is without complaints. She reports that she does have a history of some mild memory loss but it's been fairly stable. She denies any chest pain or worsening shortness of breath. She has not had reassessment unaware of her cholesterol recently. She continues on pravastatin. She also takes metoprolol and aspirin and blood pressure is well-controlled today.  PMHx:  Past Medical History  Diagnosis Date  . CAD (coronary artery disease)     mild by cath in 2010  . Hypertension   . Dyslipidemia   . MVP (mitral valve prolapse)     Past Surgical History  Procedure Laterality Date  . Cardiac catheterization  06/16/2008    normal LV systolic function, MVP with some mild MR, prox LAD disease (50%) - Dr. Mervyn SkeetersA. Little  . Cardiac catheterization  10/16/2005    normal LV systolic function, MVP with trace to 1+ MR, mild nonobstructive CAD involving LAD 30% prox, 10-20 narrowing in prox LAD (Dr. Bishop Limbo. Kelly)  . Nm myocar perf wall motion  02/2011    lexiscan myoview; perfusion dfect of 5% of LV myocardium, small area of  anteroseptal breast attenuation artifact, no reversible ischemia, EF 81%, abnormal but low risk scan    FAMHx:  Family History  Problem Relation Age of Onset  . Heart attack Mother   . Pneumonia Father     SOCHx:   reports that she has never smoked. She has never used smokeless tobacco. She reports that she does not drink alcohol. Her drug history is not on file.  ALLERGIES:  Allergies  Allergen Reactions  . Corn-Containing Products   . Other     grapes  . Phenobarbital   . Prilosec [Omeprazole]   . Sulfa Antibiotics     ROS: Pertinent items noted in HPI and remainder of comprehensive ROS otherwise negative.  HOME MEDS: Current Outpatient Prescriptions  Medication Sig Dispense Refill  . ALPRAZolam (XANAX) 0.5 MG tablet Take 0.5 mg by mouth daily as needed for anxiety.    Marland Kitchen. aspirin 500 MG tablet Take 500-1,000 mg by mouth every 6 (six) hours as needed for pain.    Marland Kitchen. aspirin 81 MG tablet Take 81 mg by mouth daily.    . calcium-vitamin D 250-100 MG-UNIT per tablet Take 1 tablet by mouth daily.    . Chlorpheniramine Maleate (ALLERGY PO) Take by mouth daily as needed.    . Cholecalciferol (VITAMIN D3) 2000 UNITS capsule Take 2,000 Units by mouth daily.    Marland Kitchen. donepezil (ARICEPT) 10 MG tablet Take 10 mg by mouth at bedtime.    .Marland Kitchen  GuaiFENesin (MUCINEX PO) Take by mouth as needed.    . metoprolol succinate (TOPROL-XL) 25 MG 24 hr tablet Take 12.5 mg by mouth daily.    . Misc Natural Products (GLUCOSAMINE CHONDROITIN MSM PO) Take 1 tablet by mouth daily.    . Multiple Vitamins-Minerals (EQL PROTECTAVISION PO) Take by mouth daily.    . Omega-3 Fatty Acids (FISH OIL) 1200 MG CAPS Take by mouth daily.    . pravastatin (PRAVACHOL) 40 MG tablet Take 40 mg by mouth daily.    . ranitidine (ZANTAC) 150 MG tablet Take 1 tablet by mouth daily.    . traMADol (ULTRAM) 50 MG tablet Take 50 mg by mouth every 6 (six) hours as needed.     No current facility-administered medications for this visit.     LABS/IMAGING: No results found for this or any previous visit (from the past 48 hour(s)). No results found.  VITALS: BP 120/76 mmHg  Pulse 62  Ht  (1.651 m)  Wt 113 lb 4.8 oz (51.393 kg)  BMI 18.85 kg/m2  EXAM: General appearance: alert and no distress Neck: no carotid bruit and no JVD Lungs: clear to auscultation bilaterally Heart: regular rate and rhythm, S1, S2 normal, no murmur, click, rub or gallop Abdomen: soft, non-tender; bowel sounds normal; no masses,  no organomegaly Extremities: extremities normal, atraumatic, no cyanosis or edema Pulses: 2+ and symmetric Skin: Skin color, texture, turgor normal. No rashes or lesions Neurologic: Grossly normal Psych: Sad  EKG: Normal sinus rhythm at 62, RBBB, LAFB  ASSESSMENT: 1. Hypertension-controlled 2. Dyslipidemia-on pravastatin 3. Mild coronary disease 4. RBBB/LAFB - stable  PLAN: 1.   Ms. [name] is doing well with the control blood pressure and cholesterol which is the goal. She's had no chest pain or other symptoms of coronary disease. She remains active. She's not had worsening shortness of breath or fatigue. She has a stable right bundle branch block it is unchanged from her prior EKG.  I recommend we check a lipid profile and adjust her medications accordingly. Otherwise plan to see her back annually or sooner as necessary.  Chrystie Nose, MD, [facility] Attending Cardiologist [facility]  Chrystie Nose 04/10/2015, 12:48 PM

## 2015-04-10 NOTE — Patient Instructions (Signed)
Your physician recommends that you return for lab work FASTING - lipid, CMET  Your physician wants you to follow-up in: 1 year with Dr. Rennis GoldenHilty. You will receive a reminder letter in the mail two months in advance. If you don't receive a letter, please call our office to schedule the follow-up appointment.

## 2015-08-15 DIAGNOSIS — N182 Chronic kidney disease, stage 2 (mild): Secondary | ICD-10-CM | POA: Diagnosis not present

## 2015-08-15 DIAGNOSIS — E7800 Pure hypercholesterolemia, unspecified: Secondary | ICD-10-CM | POA: Diagnosis not present

## 2015-08-27 DIAGNOSIS — R634 Abnormal weight loss: Secondary | ICD-10-CM | POA: Diagnosis not present

## 2015-08-27 DIAGNOSIS — N182 Chronic kidney disease, stage 2 (mild): Secondary | ICD-10-CM | POA: Diagnosis not present

## 2015-08-27 DIAGNOSIS — Z1389 Encounter for screening for other disorder: Secondary | ICD-10-CM | POA: Diagnosis not present

## 2015-08-27 DIAGNOSIS — Z681 Body mass index (BMI) 19 or less, adult: Secondary | ICD-10-CM | POA: Diagnosis not present

## 2015-08-27 DIAGNOSIS — Z9181 History of falling: Secondary | ICD-10-CM | POA: Diagnosis not present

## 2015-08-27 DIAGNOSIS — M545 Low back pain: Secondary | ICD-10-CM | POA: Diagnosis not present

## 2015-08-27 DIAGNOSIS — M8580 Other specified disorders of bone density and structure, unspecified site: Secondary | ICD-10-CM | POA: Diagnosis not present

## 2015-08-27 DIAGNOSIS — E7800 Pure hypercholesterolemia, unspecified: Secondary | ICD-10-CM | POA: Diagnosis not present

## 2015-08-27 DIAGNOSIS — I10 Essential (primary) hypertension: Secondary | ICD-10-CM | POA: Diagnosis not present

## 2015-08-27 DIAGNOSIS — R413 Other amnesia: Secondary | ICD-10-CM | POA: Diagnosis not present

## 2015-11-13 DIAGNOSIS — Z23 Encounter for immunization: Secondary | ICD-10-CM | POA: Diagnosis not present

## 2015-12-10 ENCOUNTER — Other Ambulatory Visit: Payer: Self-pay | Admitting: Nurse Practitioner

## 2015-12-10 DIAGNOSIS — Z1231 Encounter for screening mammogram for malignant neoplasm of breast: Secondary | ICD-10-CM

## 2016-01-15 ENCOUNTER — Ambulatory Visit
Admission: RE | Admit: 2016-01-15 | Discharge: 2016-01-15 | Disposition: A | Discharge status: Home / Self Care | Payer: PPO | Source: Ambulatory Visit | Attending: Nurse Practitioner | Admitting: Nurse Practitioner

## 2016-01-15 DIAGNOSIS — Z1231 Encounter for screening mammogram for malignant neoplasm of breast: Secondary | ICD-10-CM

## 2016-01-16 LAB — HM MAMMOGRAPHY: HM MAMMO: NORMAL (ref 0–4)

## 2016-02-25 DIAGNOSIS — E7800 Pure hypercholesterolemia, unspecified: Secondary | ICD-10-CM | POA: Diagnosis not present

## 2016-02-25 DIAGNOSIS — N182 Chronic kidney disease, stage 2 (mild): Secondary | ICD-10-CM | POA: Diagnosis not present

## 2016-02-29 DIAGNOSIS — E7800 Pure hypercholesterolemia, unspecified: Secondary | ICD-10-CM | POA: Diagnosis not present

## 2016-02-29 DIAGNOSIS — K219 Gastro-esophageal reflux disease without esophagitis: Secondary | ICD-10-CM | POA: Diagnosis not present

## 2016-02-29 DIAGNOSIS — F418 Other specified anxiety disorders: Secondary | ICD-10-CM | POA: Diagnosis not present

## 2016-02-29 DIAGNOSIS — Z6820 Body mass index (BMI) 20.0-20.9, adult: Secondary | ICD-10-CM | POA: Diagnosis not present

## 2016-02-29 DIAGNOSIS — N182 Chronic kidney disease, stage 2 (mild): Secondary | ICD-10-CM | POA: Diagnosis not present

## 2016-02-29 DIAGNOSIS — I10 Essential (primary) hypertension: Secondary | ICD-10-CM | POA: Diagnosis not present

## 2016-02-29 DIAGNOSIS — M8580 Other specified disorders of bone density and structure, unspecified site: Secondary | ICD-10-CM | POA: Diagnosis not present

## 2016-03-31 DIAGNOSIS — Z6821 Body mass index (BMI) 21.0-21.9, adult: Secondary | ICD-10-CM | POA: Diagnosis not present

## 2016-03-31 DIAGNOSIS — S50319A Abrasion of unspecified elbow, initial encounter: Secondary | ICD-10-CM | POA: Diagnosis not present

## 2016-03-31 DIAGNOSIS — M25521 Pain in right elbow: Secondary | ICD-10-CM | POA: Diagnosis not present

## 2016-04-01 ENCOUNTER — Other Ambulatory Visit: Payer: Self-pay | Admitting: Family Medicine

## 2016-04-01 ENCOUNTER — Ambulatory Visit
Admission: RE | Admit: 2016-04-01 | Discharge: 2016-04-01 | Disposition: A | Discharge status: Home / Self Care | Payer: PPO | Source: Ambulatory Visit | Attending: Family Medicine | Admitting: Family Medicine

## 2016-04-01 DIAGNOSIS — M25521 Pain in right elbow: Secondary | ICD-10-CM

## 2016-04-01 DIAGNOSIS — M7989 Other specified soft tissue disorders: Secondary | ICD-10-CM | POA: Diagnosis not present

## 2016-07-29 DIAGNOSIS — Z6820 Body mass index (BMI) 20.0-20.9, adult: Secondary | ICD-10-CM | POA: Diagnosis not present

## 2016-07-29 DIAGNOSIS — R310 Gross hematuria: Secondary | ICD-10-CM | POA: Diagnosis not present

## 2016-08-27 DIAGNOSIS — E7800 Pure hypercholesterolemia, unspecified: Secondary | ICD-10-CM | POA: Diagnosis not present

## 2016-08-27 DIAGNOSIS — N182 Chronic kidney disease, stage 2 (mild): Secondary | ICD-10-CM | POA: Diagnosis not present

## 2016-08-27 LAB — CBC AND DIFFERENTIAL
HEMATOCRIT: 38 (ref 36–46)
HEMOGLOBIN: 12.8 (ref 12.0–16.0)
Platelets: 218 (ref 150–399)
WBC: 5.6

## 2016-08-27 LAB — HEPATIC FUNCTION PANEL
ALT: 12 (ref 7–35)
AST: 19 (ref 13–35)
Alkaline Phosphatase: 74 (ref 25–125)
BILIRUBIN, TOTAL: 0.5

## 2016-08-27 LAB — BASIC METABOLIC PANEL
BUN: 19 (ref 4–21)
Creatinine: 0.8 (ref 0.5–1.1)
GLUCOSE: 99
POTASSIUM: 3.6 (ref 3.4–5.3)
SODIUM: 143 (ref 137–147)

## 2016-08-27 LAB — LIPID PANEL
Cholesterol: 218 — AB (ref 0–200)
HDL: 65 (ref 35–70)
LDL CALC: 136
Triglycerides: 87 (ref 40–160)

## 2016-09-01 DIAGNOSIS — N182 Chronic kidney disease, stage 2 (mild): Secondary | ICD-10-CM | POA: Diagnosis not present

## 2016-09-01 DIAGNOSIS — F418 Other specified anxiety disorders: Secondary | ICD-10-CM | POA: Diagnosis not present

## 2016-09-01 DIAGNOSIS — Z681 Body mass index (BMI) 19 or less, adult: Secondary | ICD-10-CM | POA: Diagnosis not present

## 2016-09-01 DIAGNOSIS — E7800 Pure hypercholesterolemia, unspecified: Secondary | ICD-10-CM | POA: Diagnosis not present

## 2016-09-01 DIAGNOSIS — I10 Essential (primary) hypertension: Secondary | ICD-10-CM | POA: Diagnosis not present

## 2016-09-01 DIAGNOSIS — K219 Gastro-esophageal reflux disease without esophagitis: Secondary | ICD-10-CM | POA: Diagnosis not present

## 2016-09-01 DIAGNOSIS — M545 Low back pain: Secondary | ICD-10-CM | POA: Diagnosis not present

## 2016-09-01 LAB — CBC AND DIFFERENTIAL
HEMATOCRIT: 38 (ref 36–46)
HEMOGLOBIN: 12.8 (ref 12.0–16.0)
PLATELETS: 218 (ref 150–399)
WBC: 5.6

## 2016-09-01 LAB — HEPATIC FUNCTION PANEL
ALK PHOS: 74 (ref 25–125)
ALT: 12 (ref 7–35)
AST: 19 (ref 13–35)
Bilirubin, Total: 0.5

## 2016-09-01 LAB — BASIC METABOLIC PANEL
BUN: 19 (ref 4–21)
Creatinine: 0.8 (ref 0.5–1.1)
Glucose: 99
Potassium: 3.6 (ref 3.4–5.3)
Sodium: 143 (ref 137–147)

## 2016-09-01 LAB — LIPID PANEL
CHOLESTEROL: 218 — AB (ref 0–200)
HDL: 65 (ref 35–70)
LDL Cholesterol: 136
TRIGLYCERIDES: 87 (ref 40–160)

## 2016-09-12 DIAGNOSIS — H1013 Acute atopic conjunctivitis, bilateral: Secondary | ICD-10-CM | POA: Diagnosis not present

## 2016-09-12 DIAGNOSIS — H40033 Anatomical narrow angle, bilateral: Secondary | ICD-10-CM | POA: Diagnosis not present

## 2016-09-16 DIAGNOSIS — S81801A Unspecified open wound, right lower leg, initial encounter: Secondary | ICD-10-CM | POA: Diagnosis not present

## 2016-09-16 DIAGNOSIS — Z6820 Body mass index (BMI) 20.0-20.9, adult: Secondary | ICD-10-CM | POA: Diagnosis not present

## 2016-09-19 DIAGNOSIS — Z139 Encounter for screening, unspecified: Secondary | ICD-10-CM | POA: Diagnosis not present

## 2016-09-19 DIAGNOSIS — S81801D Unspecified open wound, right lower leg, subsequent encounter: Secondary | ICD-10-CM | POA: Diagnosis not present

## 2016-09-19 DIAGNOSIS — E785 Hyperlipidemia, unspecified: Secondary | ICD-10-CM | POA: Diagnosis not present

## 2016-09-19 DIAGNOSIS — R413 Other amnesia: Secondary | ICD-10-CM | POA: Diagnosis not present

## 2016-09-19 DIAGNOSIS — Z23 Encounter for immunization: Secondary | ICD-10-CM | POA: Diagnosis not present

## 2016-09-19 DIAGNOSIS — Z6820 Body mass index (BMI) 20.0-20.9, adult: Secondary | ICD-10-CM | POA: Diagnosis not present

## 2016-09-19 DIAGNOSIS — Z136 Encounter for screening for cardiovascular disorders: Secondary | ICD-10-CM | POA: Diagnosis not present

## 2016-09-19 DIAGNOSIS — Z9181 History of falling: Secondary | ICD-10-CM | POA: Diagnosis not present

## 2016-09-19 DIAGNOSIS — Z0000 Encounter for general adult medical examination without abnormal findings: Secondary | ICD-10-CM | POA: Diagnosis not present

## 2016-10-02 DIAGNOSIS — S81801D Unspecified open wound, right lower leg, subsequent encounter: Secondary | ICD-10-CM | POA: Diagnosis not present

## 2016-10-30 DIAGNOSIS — L03115 Cellulitis of right lower limb: Secondary | ICD-10-CM | POA: Diagnosis not present

## 2016-10-30 DIAGNOSIS — Z1389 Encounter for screening for other disorder: Secondary | ICD-10-CM | POA: Diagnosis not present

## 2016-12-15 ENCOUNTER — Other Ambulatory Visit: Payer: Self-pay | Admitting: Nurse Practitioner

## 2016-12-15 DIAGNOSIS — Z139 Encounter for screening, unspecified: Secondary | ICD-10-CM

## 2017-01-15 ENCOUNTER — Ambulatory Visit: Payer: Self-pay

## 2017-01-30 DIAGNOSIS — Z6820 Body mass index (BMI) 20.0-20.9, adult: Secondary | ICD-10-CM | POA: Diagnosis not present

## 2017-01-30 DIAGNOSIS — N182 Chronic kidney disease, stage 2 (mild): Secondary | ICD-10-CM | POA: Diagnosis not present

## 2017-01-30 DIAGNOSIS — R413 Other amnesia: Secondary | ICD-10-CM | POA: Diagnosis not present

## 2017-01-30 DIAGNOSIS — I10 Essential (primary) hypertension: Secondary | ICD-10-CM | POA: Diagnosis not present

## 2017-02-24 LAB — CBC AND DIFFERENTIAL
HCT: 38 (ref 36–46)
HCT: 38 (ref 36–46)
HEMOGLOBIN: 12.6 (ref 12.0–16.0)
Hemoglobin: 12.6 (ref 12.0–16.0)
Platelets: 250 (ref 150–399)
WBC: 4.6
WBC: 4.6

## 2017-03-03 ENCOUNTER — Encounter: Payer: Self-pay | Admitting: Nurse Practitioner

## 2017-03-03 ENCOUNTER — Encounter: Payer: Self-pay | Admitting: Emergency Medicine

## 2017-03-03 DIAGNOSIS — Z79899 Other long term (current) drug therapy: Secondary | ICD-10-CM | POA: Diagnosis not present

## 2017-03-03 DIAGNOSIS — Z6820 Body mass index (BMI) 20.0-20.9, adult: Secondary | ICD-10-CM | POA: Diagnosis not present

## 2017-03-03 DIAGNOSIS — I10 Essential (primary) hypertension: Secondary | ICD-10-CM | POA: Diagnosis not present

## 2017-03-03 DIAGNOSIS — N182 Chronic kidney disease, stage 2 (mild): Secondary | ICD-10-CM | POA: Diagnosis not present

## 2017-03-03 DIAGNOSIS — R413 Other amnesia: Secondary | ICD-10-CM | POA: Diagnosis not present

## 2017-03-03 DIAGNOSIS — M545 Low back pain: Secondary | ICD-10-CM | POA: Diagnosis not present

## 2017-03-03 DIAGNOSIS — M7989 Other specified soft tissue disorders: Secondary | ICD-10-CM | POA: Diagnosis not present

## 2017-03-03 DIAGNOSIS — S8992XA Unspecified injury of left lower leg, initial encounter: Secondary | ICD-10-CM | POA: Diagnosis not present

## 2017-03-03 DIAGNOSIS — Z7982 Long term (current) use of aspirin: Secondary | ICD-10-CM | POA: Diagnosis not present

## 2017-03-03 DIAGNOSIS — L03116 Cellulitis of left lower limb: Secondary | ICD-10-CM | POA: Diagnosis not present

## 2017-03-03 LAB — BASIC METABOLIC PANEL
BUN: 28 — AB (ref 4–21)
Creatinine: 0.5 (ref 0.5–1.1)
Glucose: 102
POTASSIUM: 3.4 (ref 3.4–5.3)
Sodium: 137 (ref 137–147)

## 2017-03-05 ENCOUNTER — Encounter: Payer: Self-pay | Admitting: Nurse Practitioner

## 2017-03-05 DIAGNOSIS — R443 Hallucinations, unspecified: Secondary | ICD-10-CM | POA: Diagnosis not present

## 2017-03-05 DIAGNOSIS — K219 Gastro-esophageal reflux disease without esophagitis: Secondary | ICD-10-CM | POA: Diagnosis not present

## 2017-03-05 DIAGNOSIS — J984 Other disorders of lung: Secondary | ICD-10-CM | POA: Diagnosis not present

## 2017-03-05 DIAGNOSIS — F0390 Unspecified dementia without behavioral disturbance: Secondary | ICD-10-CM | POA: Diagnosis not present

## 2017-03-05 DIAGNOSIS — I10 Essential (primary) hypertension: Secondary | ICD-10-CM | POA: Diagnosis not present

## 2017-03-05 DIAGNOSIS — M1990 Unspecified osteoarthritis, unspecified site: Secondary | ICD-10-CM | POA: Diagnosis not present

## 2017-03-05 DIAGNOSIS — R4182 Altered mental status, unspecified: Secondary | ICD-10-CM | POA: Diagnosis not present

## 2017-03-05 LAB — BASIC METABOLIC PANEL
BUN: 26 — AB (ref 4–21)
Creatinine: 0.5 (ref 0.5–1.1)
GLUCOSE: 102
Potassium: 3.4 (ref 3.4–5.3)
SODIUM: 139 (ref 137–147)

## 2017-03-05 LAB — CBC AND DIFFERENTIAL
HEMATOCRIT: 39 (ref 36–46)
HEMOGLOBIN: 12.8 (ref 12.0–16.0)
PLATELETS: 259 (ref 150–399)
WBC: 5.4

## 2017-03-05 LAB — HEPATIC FUNCTION PANEL
ALT: 39 — AB (ref 7–35)
AST: 54 — AB (ref 13–35)
Alkaline Phosphatase: 86 (ref 25–125)
BILIRUBIN, TOTAL: 0.3

## 2017-03-06 DIAGNOSIS — F0390 Unspecified dementia without behavioral disturbance: Secondary | ICD-10-CM | POA: Diagnosis not present

## 2017-03-06 DIAGNOSIS — I10 Essential (primary) hypertension: Secondary | ICD-10-CM | POA: Diagnosis not present

## 2017-03-06 DIAGNOSIS — G301 Alzheimer's disease with late onset: Secondary | ICD-10-CM | POA: Diagnosis not present

## 2017-03-06 DIAGNOSIS — Z681 Body mass index (BMI) 19 or less, adult: Secondary | ICD-10-CM | POA: Diagnosis not present

## 2017-03-06 DIAGNOSIS — F250 Schizoaffective disorder, bipolar type: Secondary | ICD-10-CM | POA: Diagnosis not present

## 2017-03-06 DIAGNOSIS — F0281 Dementia in other diseases classified elsewhere with behavioral disturbance: Secondary | ICD-10-CM | POA: Diagnosis not present

## 2017-03-06 DIAGNOSIS — K219 Gastro-esophageal reflux disease without esophagitis: Secondary | ICD-10-CM | POA: Diagnosis not present

## 2017-03-06 DIAGNOSIS — E7800 Pure hypercholesterolemia, unspecified: Secondary | ICD-10-CM | POA: Diagnosis not present

## 2017-03-07 LAB — LIPID PANEL
CHOLESTEROL: 146 (ref 0–200)
HDL: 77 — AB (ref 35–70)
LDL Cholesterol: 61
Triglycerides: 42 (ref 40–160)

## 2017-03-07 LAB — BASIC METABOLIC PANEL: GLUCOSE: 83

## 2017-03-10 DIAGNOSIS — R443 Hallucinations, unspecified: Secondary | ICD-10-CM | POA: Diagnosis not present

## 2017-03-10 DIAGNOSIS — F418 Other specified anxiety disorders: Secondary | ICD-10-CM | POA: Diagnosis not present

## 2017-03-10 DIAGNOSIS — L97921 Non-pressure chronic ulcer of unspecified part of left lower leg limited to breakdown of skin: Secondary | ICD-10-CM | POA: Diagnosis not present

## 2017-03-10 DIAGNOSIS — I10 Essential (primary) hypertension: Secondary | ICD-10-CM | POA: Diagnosis not present

## 2017-03-10 DIAGNOSIS — Z09 Encounter for follow-up examination after completed treatment for conditions other than malignant neoplasm: Secondary | ICD-10-CM | POA: Diagnosis not present

## 2017-03-10 DIAGNOSIS — E7800 Pure hypercholesterolemia, unspecified: Secondary | ICD-10-CM | POA: Diagnosis not present

## 2017-03-10 DIAGNOSIS — F0391 Unspecified dementia with behavioral disturbance: Secondary | ICD-10-CM | POA: Diagnosis not present

## 2017-03-16 ENCOUNTER — Other Ambulatory Visit: Payer: Self-pay

## 2017-03-16 ENCOUNTER — Telehealth: Payer: Self-pay

## 2017-03-16 NOTE — Telephone Encounter (Signed)
Call daughter, she plans on calling to make a sooner appt due to increase in behaviors. Recommended her to go back to previous pcp or urgent care if there was an immediate need. Unfortunately it sounds like her mother (the pt) has dementia and her previous PCP is not familiar with how to treat her and she has ended up in the ED and behavioral health which has been very frustrating for the daughter.

## 2017-03-16 NOTE — Telephone Encounter (Signed)
Patient's daughter called to get recommendations or advice from [facility].  Patient is wandering outside, no sleep x 2 days an has several safety concerns. Patient's daughter contacted current PCP and they do not know what to do for her.  Patient has pending appointment with Abbey ChattersJessica Eubanks, NP on 03/30/17. I informed [name] since we have not see this patient and is unfamiliar with her health it is not appropriate to advise on a course of action. The best thing to do is to follow-up with current PCP until see on 03/30/17.  [name] was not satisified with that response and asked if Shanda BumpsJessica would call her.   New patient packet was mailed on 03/13/17 and is not completed

## 2017-03-16 NOTE — Patient Outreach (Signed)
[facility]) Care Management  03/16/2017  Julia GustinLillian C Henson Oct 12, 1926 [id number]  Transition of care  Referral date: 03/16/17 Referral source: [facility]17 Insurance: Health team advantage Attempt #1  Telephone call to patient regarding transition of care follow up.  Unable to reach patient. HIPAA compliant voice message left with call back phone number.  PLAN; RNCM will attempt 2nd  Telephone call to patient within  3 business days.   George InaDavina Leia Coletti RN,BSN,CCM [facility] Telephonic  [phone]

## 2017-03-17 ENCOUNTER — Other Ambulatory Visit: Payer: Self-pay

## 2017-03-17 NOTE — Patient Outreach (Signed)
[facility]) Care Management  03/17/2017  Julia GustinLillian C Bankson [birth year] [id number]     Transition of Care Referral  Referral Date: 03/16/17 Referral Source: HTA Discharge Report Date of Admission: unknown Diagnosis: unknown Date of Discharge: 03/09/17 [facility] Insurance: HTA    Outreach attempt # 2 to patient. No answer at present.     Plan: RN CM will send unsuccessful outreach letter to patient, wait 10 business days then make final call attempt and close case if no response from patient.   Antionette Fairyoshanda Divon Krabill, RN,BSN,CCM [facility] Care Management Telephonic Care Management Coordinator Direct Phone: [phone] Toll Free: [phone] Fax: [phone]

## 2017-03-26 ENCOUNTER — Telehealth: Payer: Self-pay | Admitting: *Deleted

## 2017-03-26 NOTE — Telephone Encounter (Signed)
Patient daughter called and stated that her mother is out of Donepezil. Stated that she does not have an appointment to establish with us until Monday and she wasn't sure what to do. I informed her that since she was not our patient yet we could not call anything in but I did instruct her to go to the pharmacy and and tell them the situation to see if they would give her enough pills until her appointment. She agreed.

## 2017-03-30 ENCOUNTER — Encounter: Payer: Self-pay | Admitting: Nurse Practitioner

## 2017-03-30 ENCOUNTER — Ambulatory Visit (INDEPENDENT_AMBULATORY_CARE_PROVIDER_SITE_OTHER): Payer: PPO | Admitting: Nurse Practitioner

## 2017-03-30 ENCOUNTER — Other Ambulatory Visit: Payer: Self-pay

## 2017-03-30 ENCOUNTER — Telehealth: Payer: Self-pay | Admitting: *Deleted

## 2017-03-30 VITALS — BP 114/82 | HR 63 | Temp 98.3°F | Ht 65.0 in | Wt 120.4 lb

## 2017-03-30 DIAGNOSIS — M8580 Other specified disorders of bone density and structure, unspecified site: Secondary | ICD-10-CM | POA: Diagnosis not present

## 2017-03-30 DIAGNOSIS — R413 Other amnesia: Secondary | ICD-10-CM

## 2017-03-30 DIAGNOSIS — E785 Hyperlipidemia, unspecified: Secondary | ICD-10-CM

## 2017-03-30 DIAGNOSIS — T148XXA Other injury of unspecified body region, initial encounter: Secondary | ICD-10-CM

## 2017-03-30 DIAGNOSIS — G4700 Insomnia, unspecified: Secondary | ICD-10-CM

## 2017-03-30 DIAGNOSIS — I10 Essential (primary) hypertension: Secondary | ICD-10-CM | POA: Diagnosis not present

## 2017-03-30 MED ORDER — MEMANTINE HCL ER 7 & 14 & 21 &28 MG PO CP24: ORAL_CAPSULE | ORAL | 0 refills | Status: DC

## 2017-03-30 MED ORDER — DONEPEZIL HCL 10 MG PO TABS: 10.0000 mg | ORAL_TABLET | Freq: Every day | ORAL | 0 refills | Status: DC

## 2017-03-30 NOTE — Telephone Encounter (Signed)
What is she seeing the urologist for? They did not bring up any urinary issues

## 2017-03-30 NOTE — Telephone Encounter (Signed)
Caregiver stated she meant Neurologist not Urologist. Stated When patient was going to previous PCP she had sent her to a Neurologist regarding her dementia. They couldn't see her till 05/05/17 and she is wondering if they need to keep this appointment.  Please Advise.

## 2017-03-30 NOTE — Telephone Encounter (Signed)
Patient's Caregiver called and stated that patient just had an appointment with you at 1:30 and she forgot to ask if patient needed keep the appointment she has scheduled with Urologist on 05/05/17. Please Advise.

## 2017-03-30 NOTE — Progress Notes (Signed)
Careteam: Patient Care Team: Sharon SellerEubanks, Coutney Wildermuth K, NP as PCP - General (Geriatric Medicine) Otho KetGreen, Davina E, RN as [facility] Care Management  Advanced Directive information    Allergies  Allergen Reactions  . Corn-Containing Products   . Other Hives    Grapes and banana concentrate  . Phenobarbital   . Prilosec [Omeprazole]   . Sulfa Antibiotics   . Tylenol [Acetaminophen]     Chief Complaint  Patient presents with  . Medical Management of Chronic Issues    Pt is being seen to establish care. Failed clock drawing.     HPI: Patient is a 84 y.o. female seen in the office today to establish care.  Not satisfied with previous PCP.   Has xanax on her medication list but not taking. Was told to take it for sleep.  Hyperlipidemia/CAD/MVP/HTN- using ASA 81 mg daily- having a hard time swallowing. Previously saw cardiologist but due to age she no long following with cardiologist.  Using pravastatin 40 mg daily (has a hard time swallowing)  Using MVI in the morning.   Dementia- significant memory loss, no workup was told to go to neurologist, on Aricept 10 mg doing well with that but ran out so stopped taking.   Previously living alone but last month was hospitalized for cellulitis and has been living with daughter since. Strong family hx of dementia, father died from dementia and 3 sisters with dementia.   Hx of left leg wound, scratching area and became infected.   Daughter has now taken over, she was driving a month ago but has significant decline since hospitalization. She had CT scan in 2016 and was told by 1 place it showed possible CVA and dementia but then someone else told her it was normal  Daughter is unsure what to do just coming into the medical picture but memory has significantly declined.  Pt is a poor historian and does not know her medical hx.  Sleeping is a major issue, wandering around.  More confused at night.  Sometimes will go to sleep  early, most of the time will stay up late and then get up throughout the night.   Multiple area on LE- where pt has been picking and itching area, daughter had put band aids to avoid her touching/picking area.    mini-mental status exam MMSE - Mini Mental State Exam 03/30/2017  Orientation to time 3  Orientation to Place 3  Registration 0  Attention/ Calculation 0  Recall 2  Language- name 2 objects 2  Language- repeat 0  Language- follow 3 step command 3  Language- read & follow direction 1  Write a sentence 1  Copy design 1  Total score 16    Review of Systems:  Review of Systems  Constitutional: Negative for chills, fever and weight loss.  HENT: Negative for congestion and tinnitus.   Respiratory: Negative for cough, sputum production and shortness of breath.   Cardiovascular: Negative for chest pain, palpitations and leg swelling.  Gastrointestinal: Negative for abdominal pain, constipation, diarrhea and heartburn.  Genitourinary: Negative for dysuria, frequency and urgency.  Musculoskeletal: Negative for back pain, falls, joint pain and myalgias.  Skin: Positive for itching.       Wounds on lower legs   Neurological: Negative for dizziness and headaches.  Endo/Heme/Allergies: Positive for environmental allergies.  Psychiatric/Behavioral: Positive for depression and hallucinations. Negative for memory loss. The patient is nervous/anxious and has insomnia.        Hx of  hallucinations when she had cellulitis     Past Medical History:  Diagnosis Date  . Brittle nails   . CAD (coronary artery disease)    mild by cath in 2010  . Cellulitis   . Cellulitis of left anterior lower leg   . Chronic kidney disease, stage II (mild)   . Dementia with behavioral disturbance   . Depression with anxiety 05/25/2007  . Dyslipidemia   . GERD (gastroesophageal reflux disease)   . Hallucinations   . Head ache   . Hordeolum externum of right eye   . Hypertension   . Leg ulcer, left,  limited to breakdown of skin (HCC)   . Mitral insufficiency   . MVP (mitral valve prolapse)   . Occult blood in stools   . Open wound of lower leg, right, sequela   . Open wound of lower leg, right, subsequent encounter    stage 3 ulcer  . Osteopenia   . Skin lesion of hand   . Urinary frequency   . Urticaria   . Vertigo    Past Surgical History:  Procedure Laterality Date  . APPENDECTOMY    . CARDIAC CATHETERIZATION  06/16/2008   normal LV systolic function, MVP with some mild MR, prox LAD disease (50%) - Dr. Mervyn Skeeters. Little  . CARDIAC CATHETERIZATION  10/16/2005   normal LV systolic function, MVP with trace to 1+ MR, mild nonobstructive CAD involving LAD 30% prox, 10-20 narrowing in prox LAD (Dr. Bishop Limbo)  . CATARACT EXTRACTION, BILATERAL    . CHOLECYSTECTOMY    . NM MYOCAR PERF WALL MOTION  02/2011   lexiscan myoview; perfusion dfect of 5% of LV myocardium, small area of anteroseptal breast attenuation artifact, no reversible ischemia, EF 81%, abnormal but low risk scan  . TONSILLECTOMY     Social History:   reports that  has never smoked. she has never used smokeless tobacco. She reports that she does not drink alcohol or use drugs.  Family History  Problem Relation Age of Onset  . Heart attack Mother 80  . Pneumonia Father 44  . Alzheimer's disease Father   . Diabetes Sister 86  . Heart Problems Brother 26  . Dementia Sister 47  . Pneumonia Sister        48 weeks old  . Dementia Sister 66  . Alzheimer's disease Sister 18    Medications: Patient's Medications  New Prescriptions   No medications on file  Previous Medications   ALPRAZOLAM (XANAX) 0.5 MG TABLET    Take 0.5 mg by mouth at bedtime.   ASPIRIN 81 MG TABLET    Take 81 mg by mouth daily.   DONEPEZIL (ARICEPT) 10 MG TABLET    Take 10 mg by mouth at bedtime.   METOPROLOL SUCCINATE (TOPROL-XL) 25 MG 24 HR TABLET    Take 12.5 mg by mouth daily.   MULTIPLE VITAMINS-MINERALS (EQL PROTECTAVISION PO)    Take by mouth  daily.   PRAVASTATIN (PRAVACHOL) 40 MG TABLET    Take 40 mg by mouth daily.  Modified Medications   No medications on file  Discontinued Medications   ALPRAZOLAM (XANAX) 0.5 MG TABLET    Take 0.5 mg by mouth daily as needed for anxiety.   CALCIUM-VITAMIN D 250-100 MG-UNIT PER TABLET    Take 1 tablet by mouth daily.   CHOLECALCIFEROL (VITAMIN D3) 2000 UNITS CAPSULE    Take 2,000 Units by mouth daily.   GUAIFENESIN (MUCINEX PO)    Take by mouth as  needed.   MISC NATURAL PRODUCTS (GLUCOSAMINE CHONDROITIN MSM PO)    Take 1 tablet by mouth daily.   OMEGA-3 FATTY ACIDS (FISH OIL) 1200 MG CAPS    Take by mouth daily.   RANITIDINE (ZANTAC) 150 MG TABLET    Take 1 tablet by mouth daily.   TRAMADOL (ULTRAM) 50 MG TABLET    Take 50 mg by mouth every 6 (six) hours as needed.     Physical Exam:  Vitals:   03/30/17 1314  BP: 114/82  Pulse: 63  Temp: 98.3 F (36.8 C)  TempSrc: Oral  SpO2: 95%  Weight: 120 lb 6.4 oz (54.6 kg)  Height: 5\' 5"  (1.651 m)   Body mass index is 20.04 kg/m.  Physical Exam  Constitutional: No distress.  Thin frail female   HENT:  Head: Normocephalic and atraumatic.  Mouth/Throat: Oropharynx is clear and moist. No oropharyngeal exudate.  Eyes: Conjunctivae are normal. Pupils are equal, round, and reactive to light.  Neck: Normal range of motion. Neck supple.  Cardiovascular: Normal rate, regular rhythm and normal heart sounds.  Pulmonary/Chest: Effort normal and breath sounds normal.  Abdominal: Soft. Bowel sounds are normal.  Musculoskeletal: She exhibits no edema or tenderness.  Neurological: She is alert.  Skin: Skin is warm and dry. She is not diaphoretic.  Posterior right foot with open 1 x 1 cm open wound red base, macerated edges. No odor or drainage.  Left shin 2 cm x 1 cm yellow base. No odor or drainage.    Psychiatric: Her affect is blunt. Cognition and memory are impaired. She exhibits abnormal recent memory.    Labs reviewed: Basic Metabolic  Panel: No results for input(s): NA, K, CL, CO2, GLUCOSE, BUN, CREATININE, CALCIUM, MG, PHOS, TSH in the last 8760 hours. Liver Function Tests: No results for input(s): AST, ALT, ALKPHOS, BILITOT, PROT, ALBUMIN in the last 8760 hours. No results for input(s): LIPASE, AMYLASE in the last 8760 hours. No results for input(s): AMMONIA in the last 8760 hours. CBC: No results for input(s): WBC, NEUTROABS, HGB, HCT, MCV, PLT in the last 8760 hours. Lipid Panel: Recent Labs    03/07/17  CHOL 146  HDL 77*  LDLCALC 61  TRIG 42   TSH: No results for input(s): TSH in the last 8760 hours. A1C: No results found for: HGBA1C   Assessment/Plan 1. Memory loss -MMSE 16/30 today -CT reviewed in epic from 2016 reviewed atrophy with minimal chronic small vessel ischemic disease in frontal lobes, old lacunar infarct. -will get records from recent hospitalization and CT of head (if done). - CT Head Wo Contrast; Future - TSH - COMPLETE METABOLIC PANEL WITH GFR - CBC with Differential/Platelets - RPR - Vitamin B12 - donepezil (ARICEPT) 10 MG tablet; Take 1 tablet (10 mg total) by mouth at bedtime.  Dispense: 90 tablet; Refill: 0 -to start 1/2 tablet of aricept for 1 week then increase to whole since she was taking 10 mg but has been out. -namenda titration pack ordered to start once she has been on aricept for 2 weeks.   2. Low bone mass Recommended to take caltrate with D 600/400 twice a day. With weight bearing activity  3. Insomnia, unspecified type -encouraged bedtime routine, no long naps during the day. Later bedtime if she is waking up early.  -can use melatonin 3 mg by mouth at bedtime daily  4. Abrasion -to use xeroform to areas with gauze over xeroform, to change xeroform every 3 days, gauze daily (if needed). To  keep right posterior foot dry with frequent dressing changes if needed. To notify if area not healing or getting worse. Had previously healed but pt picked area causing wound to  reopen.  5. Essential hypertension -stable off medication, recently stopped hypertensive medication   6. Dyslipidemia Continues on pravachol    Next appt: 1 month Laurene Melendrez K. Biagio Borg  [facility] [phone]

## 2017-03-30 NOTE — Patient Instructions (Addendum)
Recommended to take caltrate with D 600/400 twice a day due to low bone mass- Can look for gummies for this.  Calcium 1200 mg daily and vit D 800 units daily   To start aricept 5 mg daily for 1 week then increase aricept 10 mg daily After 2 weeks of being on Aricept start namenda titration pack  Melatonin 3 mg by mouth every evening around the same time.  Sleep routine.  Follow up in 1 month for EV in 4 weeks To sign record release for [place] [facility] and [facility]

## 2017-03-30 NOTE — Patient Outreach (Signed)
[facility] Care Management  03/30/2017  Cleon GustinLillian C Mustard [birth year] [id number]  Transition of care  Referral date: 03/16/17 Referral source: [facility]17 Insurance: Health team advantage Attempt #3  Telephone call to patient regarding transition of care follow up.  Unable to reach patient. HIPAA compliant voice message left with call back phone number.  PLAN; RNCM will refer patient to care management assistant to close patient due to being unable to reach.  RNCM will send notification to patients primary MD of closure.   George InaDavina Lark Langenfeld RN,BSN,CCM [facility] Telephonic  [phone]

## 2017-03-31 ENCOUNTER — Telehealth: Payer: Self-pay | Admitting: *Deleted

## 2017-03-31 LAB — CBC WITH DIFFERENTIAL/PLATELET
BASOS PCT: 0.4 %
Basophils Absolute: 28 cells/uL (ref 0–200)
EOS ABS: 140 {cells}/uL (ref 15–500)
Eosinophils Relative: 2 %
HEMATOCRIT: 37.1 % (ref 35.0–45.0)
HEMOGLOBIN: 12.7 g/dL (ref 11.7–15.5)
Lymphs Abs: 1645 cells/uL (ref 850–3900)
MCH: 30.5 pg (ref 27.0–33.0)
MCHC: 34.2 g/dL (ref 32.0–36.0)
MCV: 89 fL (ref 80.0–100.0)
MPV: 9.8 fL (ref 7.5–12.5)
Monocytes Relative: 6.8 %
Neutro Abs: 4711 cells/uL (ref 1500–7800)
Neutrophils Relative %: 67.3 %
PLATELETS: 256 10*3/uL (ref 140–400)
RBC: 4.17 10*6/uL (ref 3.80–5.10)
RDW: 12.8 % (ref 11.0–15.0)
Total Lymphocyte: 23.5 %
WBC mixed population: 476 cells/uL (ref 200–950)
WBC: 7 10*3/uL (ref 3.8–10.8)

## 2017-03-31 LAB — COMPLETE METABOLIC PANEL WITH GFR
AG Ratio: 1.3 (calc) (ref 1.0–2.5)
ALT: 21 U/L (ref 6–29)
AST: 22 U/L (ref 10–35)
Albumin: 3.9 g/dL (ref 3.6–5.1)
Alkaline phosphatase (APISO): 68 U/L (ref 33–130)
BUN: 20 mg/dL (ref 7–25)
CO2: 32 mmol/L (ref 20–32)
CREATININE: 0.83 mg/dL (ref 0.60–0.88)
Calcium: 9.6 mg/dL (ref 8.6–10.4)
Chloride: 101 mmol/L (ref 98–110)
GFR, Est African American: 73 mL/min/{1.73_m2} (ref >=60)
GFR, Est Non African American: 63 mL/min/{1.73_m2} (ref >=60)
GLOBULIN: 3.1 g/dL (ref 1.9–3.7)
Glucose, Bld: 140 mg/dL — ABNORMAL HIGH (ref 65–139)
Potassium: 4.2 mmol/L (ref 3.5–5.3)
Sodium: 140 mmol/L (ref 135–146)
TOTAL PROTEIN: 7 g/dL (ref 6.1–8.1)
Total Bilirubin: 0.3 mg/dL (ref 0.2–1.2)

## 2017-03-31 LAB — TSH: TSH: 6.79 mIU/L — ABNORMAL HIGH (ref 0.40–4.50)

## 2017-03-31 LAB — RPR: RPR Ser Ql: NONREACTIVE

## 2017-03-31 LAB — VITAMIN B12: VITAMIN B 12: 324 pg/mL (ref 200–1100)

## 2017-03-31 MED ORDER — MEMANTINE HCL 28 X 5 MG & 21 X 10 MG PO TABS: ORAL_TABLET | ORAL | 12 refills | Status: DC

## 2017-03-31 NOTE — Telephone Encounter (Signed)
Sent Rx for regular namenda to pharmacy vs XR if they do not have this we will have to wait until supply is available. For now she should be restarting aricept 5 mg daily for 1 week then 10 mg daily for 1 week before starting aricept, awaiting to see if head CT was obtained during last hospitalization in regard to the neurology follow up. It is totally up to them if they want to go or not, if she wanted another opinion on the brain scans.

## 2017-03-31 NOTE — Telephone Encounter (Signed)
Patient daughter notified and agreed.  

## 2017-03-31 NOTE — Telephone Encounter (Signed)
Patient caregiver called and stated that patient's pharmacy cannot get the Memantine Pack #28. On backorder.  Stated that they have called 4 other places and they don't have it and they cannot get it either. Please Advise.

## 2017-03-31 NOTE — Telephone Encounter (Signed)
See note from 03/31/17

## 2017-03-31 NOTE — Telephone Encounter (Signed)
Patient notified and agreed.  

## 2017-04-01 ENCOUNTER — Telehealth: Payer: Self-pay | Admitting: *Deleted

## 2017-04-01 DIAGNOSIS — F0391 Unspecified dementia with behavioral disturbance: Secondary | ICD-10-CM

## 2017-04-01 NOTE — Telephone Encounter (Signed)
I called the pharmacy to find out of any other alternative medications that insurance would cover and there are none.   Pharmacist stated that a PA could be done to see if insurance would make an exception for the memantine. Awaiting paperwork.

## 2017-04-01 NOTE — Telephone Encounter (Signed)
A prior authorization has been initiated on [email or web address] to see if an exception can be made to cover memantine since the namenda XR is unavailable. Awaiting response.    Patient's daughter has notified that prior authorization has been completed.    Keyword: [id number] Last name: Nienow DOB: 12/02/1926

## 2017-04-01 NOTE — Telephone Encounter (Signed)
Patient daughter called and stated that the memory medication that you prescribed is not covered by insurance stated that it is going to cost $285.00. Is there an alternative. Please Advise.

## 2017-04-02 MED ORDER — MEMANTINE HCL 5 MG PO TABS: ORAL_TABLET | ORAL | 1 refills | Status: DC

## 2017-04-02 NOTE — Telephone Encounter (Signed)
PA form was faxed to the office from [facility] to be signed by provider. Last office note and phone conversation were faxed back with forms.   Awaiting response

## 2017-04-02 NOTE — Telephone Encounter (Signed)
Prescription for memantine 5 mg tablet. Take 7 tablet by mouth once daily for 1 week; then take 1 tablet by mouth twice daily for 1 week; then take 2 tablets by mouth once daily for 1 week; then take 2 tablets by mouth twice daily. #120 with 1 refill was faxed to [facility] at [phone].   I spoke with pharmacist and was told that Rx will be covered.   I spoke with patient's daughter and she verbalized understanding.

## 2017-04-02 NOTE — Addendum Note (Signed)
Addended by: Chriss DriverLANE, Ethylene Reznick L on: 04/02/2017 03:54 PM   Modules accepted: Orders

## 2017-04-02 NOTE — Telephone Encounter (Signed)
I spoke with the pharmacist at [facility] to see the insurance had approved the Rx for the generic memantine titration pack. The pharmacist stated that there is no such thing as a generic titration pack. Insurance has denied the claim for the namenda titration pack.   Any other suggestions from provider?

## 2017-04-02 NOTE — Telephone Encounter (Signed)
I have not had this trouble before with ordering namenda titration pack. We could give them namenda 5 mg tablets and have her start 1 tablet daily for 1 week then to increase to 1 tablet twice daily for 1 week then start 10 mg (2 tablets) daily x 1 week then to increase to 10 mg 2 tablets) twice daily.

## 2017-04-02 NOTE — Telephone Encounter (Signed)
[facility] called to ask if a prescription for generic memantine titration pack could be sent to the pharmacy. I explained that we have sent the generic so insurance is going to try to run medication as generic. Awaiting response.

## 2017-04-10 ENCOUNTER — Telehealth: Payer: Self-pay | Admitting: *Deleted

## 2017-04-10 NOTE — Telephone Encounter (Signed)
[facility] #[phone].   Order Date:03/05/17  CT Head without Contrast: Impression: Chronic small vessel disease without acute intracranial abnormality   Placed in Jessica's folder to review.

## 2017-04-22 ENCOUNTER — Other Ambulatory Visit: Payer: Self-pay

## 2017-04-27 ENCOUNTER — Ambulatory Visit (INDEPENDENT_AMBULATORY_CARE_PROVIDER_SITE_OTHER): Payer: PPO

## 2017-04-27 ENCOUNTER — Ambulatory Visit: Payer: PPO | Admitting: Nurse Practitioner

## 2017-04-27 ENCOUNTER — Ambulatory Visit (INDEPENDENT_AMBULATORY_CARE_PROVIDER_SITE_OTHER): Payer: PPO | Admitting: Nurse Practitioner

## 2017-04-27 ENCOUNTER — Encounter: Payer: Self-pay | Admitting: Nurse Practitioner

## 2017-04-27 ENCOUNTER — Ambulatory Visit: Payer: Self-pay | Admitting: Nurse Practitioner

## 2017-04-27 VITALS — BP 150/70 | HR 68 | Temp 97.3°F | Ht 65.0 in | Wt 115.0 lb

## 2017-04-27 DIAGNOSIS — M8580 Other specified disorders of bone density and structure, unspecified site: Secondary | ICD-10-CM | POA: Diagnosis not present

## 2017-04-27 DIAGNOSIS — Z0000 Encounter for general adult medical examination without abnormal findings: Secondary | ICD-10-CM | POA: Diagnosis not present

## 2017-04-27 DIAGNOSIS — F0391 Unspecified dementia with behavioral disturbance: Secondary | ICD-10-CM | POA: Diagnosis not present

## 2017-04-27 DIAGNOSIS — T148XXA Other injury of unspecified body region, initial encounter: Secondary | ICD-10-CM

## 2017-04-27 DIAGNOSIS — L602 Onychogryphosis: Secondary | ICD-10-CM | POA: Diagnosis not present

## 2017-04-27 DIAGNOSIS — I10 Essential (primary) hypertension: Secondary | ICD-10-CM

## 2017-04-27 DIAGNOSIS — G4700 Insomnia, unspecified: Secondary | ICD-10-CM | POA: Diagnosis not present

## 2017-04-27 MED ORDER — MEMANTINE HCL 10 MG PO TABS: 10.0000 mg | ORAL_TABLET | Freq: Two times a day (BID) | ORAL | 3 refills | Status: DC

## 2017-04-27 MED ORDER — ZOSTER VAC RECOMB ADJUVANTED 50 MCG/0.5ML IM SUSR: 0.5000 mL | Freq: Once | INTRAMUSCULAR | 1 refills | Status: AC

## 2017-04-27 MED ORDER — TRAZODONE HCL 50 MG PO TABS: 25.0000 mg | ORAL_TABLET | Freq: Every evening | ORAL | 3 refills | Status: DC | PRN

## 2017-04-27 MED ORDER — DONEPEZIL HCL 10 MG PO TABS: 10.0000 mg | ORAL_TABLET | Freq: Every day | ORAL | 0 refills | Status: DC

## 2017-04-27 NOTE — Progress Notes (Signed)
Provider: Sharon Seller, NP  Patient Care Team: Sharon Seller, NP as PCP - General (Geriatric Medicine)  Extended Emergency Contact Information Primary Emergency Contact: Clapp,[name] Address: [phone], [place] [address] [place] Home Phone: [phone] Mobile Phone: [phone] Relation: Daughter Allergies  Allergen Reactions  . Corn-Containing Products   . Other Hives    Grapes and banana concentrate  . Phenobarbital   . Prilosec [Omeprazole]   . Sulfa Antibiotics Rash   Code Status: DNR Goals of Care: Advanced Directive information Advanced Directives 04/27/2017  Does Patient Have a Medical Advance Directive? Yes  Type of Estate agent of [place];Living will;Out of facility DNR (pink MOST or yellow form)  Does patient want to make changes to medical advance directive? No - Patient declined  Copy of Healthcare Power of Attorney in Chart? No - copy requested  Pre-existing out of facility DNR order (yellow form or pink MOST form) Yellow form placed in chart (order not valid for inpatient use)     Chief Complaint  Patient presents with  . Annual Exam    Yearly check-up,EKG, anf AWV completed today. + fall risk   . Medication Refill    HPI: Patient is a 84 y.o. female seen in today for an annual wellness exam.   After last OV started Namenda titration pack, this week started the namenda 10 mg BID, behaviors in the evening have somewhat improved.  Some days are bad days others are better.  Also taking aricept 10 mg by mouth daily at bedtime.   Eating very well, will eat 3 meals a day- living with daughter and "not missing a meal"-  weight is down from last visit.  Drinking a lot of water.     Depression screen [facility] 2/9 04/27/2017 03/30/2017  Decreased Interest 0 0  Down, Depressed, Hopeless 0 0  PHQ - 2 Score 0 0    Fall Risk  04/27/2017 03/30/2017  Falls in the past year? Yes Yes  Number falls in past  yr: 2 or more 2 or more  Injury with Fall? Yes Yes  no recent fall, other falls were related to her dog, she no longer has to take the dog out on a leash    MMSE - Mini Mental State Exam 03/30/2017  Orientation to time 3  Orientation to Place 3  Registration 0  Attention/ Calculation 0  Recall 2  Language- name 2 objects 2  Language- repeat 0  Language- follow 3 step command 3  Language- read & follow direction 1  Write a sentence 1  Copy design 1  Total score 16     Health Maintenance  Topic Date Due  . INFLUENZA VACCINE  08/20/2017  . TETANUS/TDAP  05/06/2021  . DEXA SCAN  Completed  . PNA vac Low Risk Adult  Completed    Ophthalmologist- yearly- overdue   Dentition: every 6 months months   Pain:none  Past Medical History:  Diagnosis Date  . Brittle nails   . CAD (coronary artery disease)    mild by cath in 2010  . Cellulitis   . Cellulitis of left anterior lower leg   . Chronic kidney disease, stage II (mild)   . Dementia with behavioral disturbance   . Depression with anxiety 05/25/2007  . Dyslipidemia   . GERD (gastroesophageal reflux disease)   . Hallucinations   . Head ache   . Hordeolum externum  of right eye   . Hypertension   . Leg ulcer, left, limited to breakdown of skin (HCC)   . Mitral insufficiency   . MVP (mitral valve prolapse)   . Occult blood in stools   . Open wound of lower leg, right, sequela   . Open wound of lower leg, right, subsequent encounter    stage 3 ulcer  . Osteopenia   . Skin lesion of hand   . Urinary frequency   . Urticaria   . Vertigo     Past Surgical History:  Procedure Laterality Date  . APPENDECTOMY    . CARDIAC CATHETERIZATION  06/16/2008   normal LV systolic function, MVP with some mild MR, prox LAD disease (50%) - Dr. Mervyn Skeeters. [place]  . CARDIAC CATHETERIZATION  10/16/2005   normal LV systolic function, MVP with trace to 1+ MR, mild nonobstructive CAD involving LAD 30% prox, 10-20 narrowing in prox LAD (Dr. Bishop Limbo)  . CATARACT EXTRACTION, BILATERAL    . CHOLECYSTECTOMY    . NM MYOCAR PERF WALL MOTION  02/2011   lexiscan myoview; perfusion dfect of 5% of LV myocardium, small area of anteroseptal breast attenuation artifact, no reversible ischemia, EF 81%, abnormal but low risk scan  . TONSILLECTOMY      Social History   Socioeconomic History  . Marital status: Widowed    Spouse name: Not on file  . Number of children: 1  . Years of education: 70  . Highest education level: Not on file  Occupational History    Employer: RETIRED  Social Needs  . Financial resource strain: Not hard at all  . Food insecurity:    Worry: Never true    Inability: Never true  . Transportation needs:    Medical: No    Non-medical: No  Tobacco Use  . Smoking status: Never Smoker  . Smokeless tobacco: Never Used  Substance and Sexual Activity  . Alcohol use: No  . Drug use: No  . Sexual activity: Never  Lifestyle  . Physical activity:    Days per week: 2 days    Minutes per session: 30 min  . Stress: Only a little  Relationships  . Social connections:    Talks on phone: More than three times a week    Gets together: More than three times a week    Attends religious service: More than 4 times per year    Active member of club or organization: No    Attends meetings of clubs or organizations: Never    Relationship status: Widowed  Other Topics Concern  . Not on file  Social History Narrative   Social History      Diet?       Do you drink/eat things with caffeine?      Marital status?       widowed                             What year were you married? 1949      Do you live in a house, apartment, assisted living, condo, trailer, etc.? home      Is it one or more stories? one      How many persons live in your home? 2      Do you have any pets in your home? (please list) yes, Fleda's dog-Susie and daughter's dog Molly      Highest level of education completed? 12  Current or past  profession: [employer]      Do you exercise?              yes                        Type & how often? Chair exercises at [employer] in [place]      Advanced Directives      Do you have a living will? yes      Do you have a DNR form?                                  If not, do you want to discuss one?      Do you have signed POA/HPOA for forms?  yes      Functional Status      Do you have difficulty bathing or dressing yourself? no      Do you have difficulty preparing food or eating? No- daughter prepares food now      Do you have difficulty managing your medications? yes      Do you have difficulty managing your finances? yes      Do you have difficulty affording your medications? No-insurance    Family History  Problem Relation Age of Onset  . Heart attack Mother 27  . Pneumonia Father 35  . Alzheimer's disease Father   . Diabetes Sister 80  . Heart Problems Brother 44  . Dementia Sister 69  . Pneumonia Sister        55 weeks old  . Dementia Sister 74  . Alzheimer's disease Sister 60    Review of Systems:  Review of Systems  Unable to perform ROS: Dementia     Allergies as of 04/27/2017      Reactions   Corn-containing Products    Other Hives   Grapes and banana concentrate   Phenobarbital    Prilosec [omeprazole]    Sulfa Antibiotics Rash      Medication List        Accurate as of 04/27/17  1:16 PM. Always use your most recent med list.          aspirin 81 MG tablet Take 81 mg by mouth daily.   donepezil 10 MG tablet Commonly known as:  ARICEPT Take 1 tablet (10 mg total) by mouth at bedtime.   EQL PROTECTAVISION PO Take by mouth daily.   memantine 5 MG tablet Commonly known as:  NAMENDA Take 1 tablet by mouth once daily for 1 week; then take 1 tablet by mouth twice daily for 1 week; then take 2 tablets by mouth once daily for 1 week; then take 2 tablets by mouth twice daily   pravastatin 40 MG tablet Commonly known  as:  PRAVACHOL Take 40 mg by mouth daily.   Zoster Vaccine Adjuvanted injection Commonly known as:  SHINGRIX Inject 0.5 mLs into the muscle once for 1 dose.         Physical Exam: Vitals:   04/27/17 1308  BP: (!) 150/70  Pulse: 68  Temp: (!) 97.3 F (36.3 C)  TempSrc: Oral  SpO2: 98%  Weight: 115 lb (52.2 kg)  Height: 5\' 5"  (1.651 m)   Body mass index is 19.14 kg/m. Physical Exam  Constitutional: No distress.  Thin frail female   HENT:  Head: Normocephalic and atraumatic.  Mouth/Throat: Oropharynx is  clear and moist. No oropharyngeal exudate.  Eyes: Pupils are equal, round, and reactive to light. Conjunctivae are normal.  Neck: Normal range of motion. Neck supple.  Cardiovascular: Normal rate, regular rhythm and normal heart sounds.  Pulmonary/Chest: Effort normal and breath sounds normal.  Abdominal: Soft. Bowel sounds are normal. She exhibits no distension.  Musculoskeletal: Normal range of motion. She exhibits no edema or tenderness.  Neurological: She is alert.  Skin: Skin is warm and dry. She is not diaphoretic.  Posterior right foot area has healed  Left shin 1 cm x 1 cm; with scab  No odor or drainage.  Chronic venous statis changes noted to bilateral LE, no heat or tenderness noted   Psychiatric: Her affect is blunt. Cognition and memory are impaired. She exhibits abnormal recent memory.    Labs reviewed: Basic Metabolic Panel: Recent Labs    03/03/17 03/05/17 03/30/17 1431  NA 137 139 140  K 3.4 3.4 4.2  CL  --   --  101  CO2  --   --  32  GLUCOSE  --   --  140*  BUN 28* 26* 20  CREATININE 0.5 0.5 0.83  CALCIUM  --   --  9.6  TSH  --   --  6.79*   Liver Function Tests: Recent Labs    03/05/17 03/30/17 1431  AST 54* 22  ALT 39* 21  ALKPHOS 86  --   BILITOT  --  0.3  PROT  --  7.0   No results for input(s): LIPASE, AMYLASE in the last 8760 hours. No results for input(s): AMMONIA in the last 8760 hours. CBC: Recent Labs    02/24/17  03/05/17 03/30/17 1431  WBC 4.6  4.6 5.4 7.0  NEUTROABS  --   --  4,711  HGB 12.6  12.6 12.8 12.7  HCT 38  38 39 37.1  MCV  --   --  89.0  PLT 250 259 256   Lipid Panel: Recent Labs    03/07/17  CHOL 146  HDL 77*  LDLCALC 61  TRIG 42   No results found for: HGBA1C  Procedures: No results found.  Assessment/Plan 1. Essential hypertension -off medication at this time, slightly elevated today however in the past has been in normal range, will continue to monitor.  - EKG 12-Lead  2. Dementia with behavioral disturbance, unspecified dementia type -behaviors have slightly improved with namenda.  -to continue routine at daughters house, goes to adult day program.  -information for caregiver support given to daughter  - Ambulatory referral to Neurology- has appt scheduled but from different provider. Since we are now her PCP new referral sent. - DNR (Do Not Resuscitate) - donepezil (ARICEPT) 10 MG tablet; Take 1 tablet (10 mg total) by mouth at bedtime.  Dispense: 90 tablet; Refill: 0 - memantine (NAMENDA) 10 MG tablet; Take 1 tablet (10 mg total) by mouth 2 (two) times daily.  Dispense: 180 tablet; Refill: 3  3. Osteopenia, unspecified location -to continue calcium and vit D - DG Bone Density; Future  4. Wellness examination -AWV done today and reviewed. Currently living with daughter at this time due to progression of memory loss/dementia.  -will follow up Dexa scan   5. Insomnia, unspecified type -on melatonin but sleep continues to be an issue, sleeping 4 hours at night at times. Sometimes will nap other times will not.  -to continue bedtime routine. Will add trazodone to help with sleep - traZODone (DESYREL) 50 MG tablet; Take  0.5 tablets (25 mg total) by mouth at bedtime as needed for sleep.  Dispense: 30 tablet; Refill: 3  6.  Overgrown toenails - Ambulatory referral to Podiatry  7. Abrasions -healing at this time. To notify if areas become worse, red, draining,  or painful.   Next appt: 07/27/2017 Janene HarveyJessica K. Biagio Borgubanks, AGNP  [facility] [phone]

## 2017-04-27 NOTE — Patient Instructions (Signed)
Julia Henson , Thank you for taking time to come for your Medicare Wellness Visit. I appreciate your ongoing commitment to your health goals. Please review the following plan we discussed and let me know if I can assist you in the future.   Screening recommendations/referrals: Colonoscopy excluded, you are over age 82 Mammogram excluded, you are over age 68 Bone Density up to date, due 03/27/2019 Recommended yearly ophthalmology/optometry visit for glaucoma screening and checkup Recommended yearly dental visit for hygiene and checkup  Vaccinations: Influenza vaccine due 2019 fall season Pneumococcal vaccine up to date, completed Tdap vaccine up to date, due 05/06/2021 Shingles vaccine due, prescription sent to pharmacy    Advanced directives: none  Conditions/risks identified: none  Next appointment: Tyron Russell, RN 04/30/2018 @ 1:45pm   Preventive Care 65 Years and Older, Female Preventive care refers to lifestyle choices and visits with your health care provider that can promote health and wellness. What does preventive care include?  A yearly physical exam. This is also called an annual well check.  Dental exams once or twice a year.  Routine eye exams. Ask your health care provider how often you should have your eyes checked.  Personal lifestyle choices, including:  Daily care of your teeth and gums.  Regular physical activity.  Eating a healthy diet.  Avoiding tobacco and drug use.  Limiting alcohol use.  Practicing safe sex.  Taking low-dose aspirin every day.  Taking vitamin and mineral supplements as recommended by your health care provider. What happens during an annual well check? The services and screenings done by your health care provider during your annual well check will depend on your age, overall health, lifestyle risk factors, and family history of disease. Counseling  Your health care provider may ask you questions about your:  Alcohol  use.  Tobacco use.  Drug use.  Emotional well-being.  Home and relationship well-being.  Sexual activity.  Eating habits.  History of falls.  Memory and ability to understand (cognition).  Work and work Astronomer.  Reproductive health. Screening  You may have the following tests or measurements:  Height, weight, and BMI.  Blood pressure.  Lipid and cholesterol levels. These may be checked every 5 years, or more frequently if you are over 68 years old.  Skin check.  Lung cancer screening. You may have this screening every year starting at age 6 if you have a 30-pack-year history of smoking and currently smoke or have quit within the past 15 years.  Fecal occult blood test (FOBT) of the stool. You may have this test every year starting at age 38.  Flexible sigmoidoscopy or colonoscopy. You may have a sigmoidoscopy every 5 years or a colonoscopy every 10 years starting at age 23.  Hepatitis C blood test.  Hepatitis B blood test.  Sexually transmitted disease (STD) testing.  Diabetes screening. This is done by checking your blood sugar (glucose) after you have not eaten for a while (fasting). You may have this done every 1-3 years.  Bone density scan. This is done to screen for osteoporosis. You may have this done starting at age 80.  Mammogram. This may be done every 1-2 years. Talk to your health care provider about how often you should have regular mammograms. Talk with your health care provider about your test results, treatment options, and if necessary, the need for more tests. Vaccines  Your health care provider may recommend certain vaccines, such as:  Influenza vaccine. This is recommended every year.  Tetanus,  diphtheria, and acellular pertussis (Tdap, Td) vaccine. You may need a Td booster every 10 years.  Zoster vaccine. You may need this after age 37.  Pneumococcal 13-valent conjugate (PCV13) vaccine. One dose is recommended after age  61.  Pneumococcal polysaccharide (PPSV23) vaccine. One dose is recommended after age 79. Talk to your health care provider about which screenings and vaccines you need and how often you need them. This information is not intended to replace advice given to you by your health care provider. Make sure you discuss any questions you have with your health care provider. Document Released: 02/02/2015 Document Revised: 09/26/2015 Document Reviewed: 11/07/2014 Elsevier Interactive Patient Education  2017 [place] Prevention in the Home Falls can cause injuries. They can happen to people of all ages. There are many things you can do to make your home safe and to help prevent falls. What can I do on the outside of my home?  Regularly fix the edges of walkways and driveways and fix any cracks.  Remove anything that might make you trip as you walk through a door, such as a raised step or threshold.  Trim any bushes or trees on the path to your home.  Use bright outdoor lighting.  Clear any walking paths of anything that might make someone trip, such as rocks or tools.  Regularly check to see if handrails are loose or broken. Make sure that both sides of any steps have handrails.  Any raised decks and porches should have guardrails on the edges.  Have any leaves, snow, or ice cleared regularly.  Use sand or salt on walking paths during winter.  Clean up any spills in your garage right away. This includes oil or grease spills. What can I do in the bathroom?  Use night lights.  Install grab bars by the toilet and in the tub and shower. Do not use towel bars as grab bars.  Use non-skid mats or decals in the tub or shower.  If you need to sit down in the shower, use a plastic, non-slip stool.  Keep the floor dry. Clean up any water that spills on the floor as soon as it happens.  Remove soap buildup in the tub or shower regularly.  Attach bath mats securely with double-sided  non-slip rug tape.  Do not have throw rugs and other things on the floor that can make you trip. What can I do in the bedroom?  Use night lights.  Make sure that you have a light by your bed that is easy to reach.  Do not use any sheets or blankets that are too big for your bed. They should not hang down onto the floor.  Have a firm chair that has side arms. You can use this for support while you get dressed.  Do not have throw rugs and other things on the floor that can make you trip. What can I do in the kitchen?  Clean up any spills right away.  Avoid walking on wet floors.  Keep items that you use a lot in easy-to-reach places.  If you need to reach something above you, use a strong step stool that has a grab bar.  Keep electrical cords out of the way.  Do not use floor polish or wax that makes floors slippery. If you must use wax, use non-skid floor wax.  Do not have throw rugs and other things on the floor that can make you trip. What can I do with  my stairs?  Do not leave any items on the stairs.  Make sure that there are handrails on both sides of the stairs and use them. Fix handrails that are broken or loose. Make sure that handrails are as long as the stairways.  Check any carpeting to make sure that it is firmly attached to the stairs. Fix any carpet that is loose or worn.  Avoid having throw rugs at the top or bottom of the stairs. If you do have throw rugs, attach them to the floor with carpet tape.  Make sure that you have a light switch at the top of the stairs and the bottom of the stairs. If you do not have them, ask someone to add them for you. What else can I do to help prevent falls?  Wear shoes that:  Do not have high heels.  Have rubber bottoms.  Are comfortable and fit you well.  Are closed at the toe. Do not wear sandals.  If you use a stepladder:  Make sure that it is fully opened. Do not climb a closed stepladder.  Make sure that both  sides of the stepladder are locked into place.  Ask someone to hold it for you, if possible.  Clearly mark and make sure that you can see:  Any grab bars or handrails.  First and last steps.  Where the edge of each step is.  Use tools that help you move around (mobility aids) if they are needed. These include:  Canes.  Walkers.  Scooters.  Crutches.  Turn on the lights when you go into a dark area. Replace any light bulbs as soon as they burn out.  Set up your furniture so you have a clear path. Avoid moving your furniture around.  If any of your floors are uneven, fix them.  If there are any pets around you, be aware of where they are.  Review your medicines with your doctor. Some medicines can make you feel dizzy. This can increase your chance of falling. Ask your doctor what other things that you can do to help prevent falls. This information is not intended to replace advice given to you by your health care provider. Make sure you discuss any questions you have with your health care provider. Document Released: 11/02/2008 Document Revised: 06/14/2015 Document Reviewed: 02/10/2014 Elsevier Interactive Patient Education  2017 [employer]

## 2017-04-27 NOTE — Patient Instructions (Addendum)
[facility] Phone: [phone]  [address], [place], [place] [address]  [facility] [place] Address: [address], [place], [place] [address] Phone: [phone]   Start tylenol (acetaminophen) 325 mg by mouth 2 tablets in the morning and 2 tablets at bedtime to help with pain May have acetaminophen 325 mg 2 tablet every 6 hours as needed for pain- max 3000 in 24 hours  If sleep continues to be an issue to start trazodone 25 mg by mouth at night *corn containing product

## 2017-04-27 NOTE — Progress Notes (Signed)
Subjective:   Julia Henson is a 84 y.o. female who presents for an Initial Medicare Annual Wellness Visit.     Objective:    Today's Vitals   04/27/17 1241  BP: (!) 150/70  Pulse: 68  Temp: (!) 97.3 F (36.3 C)  TempSrc: Oral  SpO2: 95%  Weight: 115 lb (52.2 kg)  Height: 5\' 5"  (1.651 m)  Provider notified of BP Body mass index is 19.14 kg/m.  Advanced Directives 04/27/2017  Does Patient Have a Medical Advance Directive? Yes  Type of Estate agent of [place];Living will;Out of facility DNR (pink MOST or yellow form)  Does patient want to make changes to medical advance directive? No - Patient declined  Copy of Healthcare Power of Attorney in Chart? No - copy requested  Pre-existing out of facility DNR order (yellow form or pink MOST form) Yellow form placed in chart (order not valid for inpatient use)    Current Medications (verified) Outpatient Encounter Medications as of 04/27/2017  Medication Sig  . aspirin 81 MG tablet Take 81 mg by mouth daily.  Marland Kitchen donepezil (ARICEPT) 10 MG tablet Take 1 tablet (10 mg total) by mouth at bedtime.  . memantine (NAMENDA) 5 MG tablet Take 1 tablet by mouth once daily for 1 week; then take 1 tablet by mouth twice daily for 1 week; then take 2 tablets by mouth once daily for 1 week; then take 2 tablets by mouth twice daily  . Multiple Vitamins-Minerals (EQL PROTECTAVISION PO) Take by mouth daily.  . pravastatin (PRAVACHOL) 40 MG tablet Take 40 mg by mouth daily.  Marland Kitchen Zoster Vaccine Adjuvanted [facility]) injection Inject 0.5 mLs into the muscle once for 1 dose.  . [DISCONTINUED] Zoster Vaccine Adjuvanted [facility]) injection Inject 0.5 mLs into the muscle once.   No facility-administered encounter medications on file as of 04/27/2017.     Allergies (verified) Corn-containing products; Other; Phenobarbital; Prilosec [omeprazole]; and Sulfa antibiotics   History: Past Medical History:  Diagnosis Date  . Brittle nails     . CAD (coronary artery disease)    mild by cath in 2010  . Cellulitis   . Cellulitis of left anterior lower leg   . Chronic kidney disease, stage II (mild)   . Dementia with behavioral disturbance   . Depression with anxiety 05/25/2007  . Dyslipidemia   . GERD (gastroesophageal reflux disease)   . Hallucinations   . Head ache   . Hordeolum externum of right eye   . Hypertension   . Leg ulcer, left, limited to breakdown of skin (HCC)   . Mitral insufficiency   . MVP (mitral valve prolapse)   . Occult blood in stools   . Open wound of lower leg, right, sequela   . Open wound of lower leg, right, subsequent encounter    stage 3 ulcer  . Osteopenia   . Skin lesion of hand   . Urinary frequency   . Urticaria   . Vertigo    Past Surgical History:  Procedure Laterality Date  . APPENDECTOMY    . CARDIAC CATHETERIZATION  06/16/2008   normal LV systolic function, MVP with some mild MR, prox LAD disease (50%) - Dr. Mervyn Skeeters. [facility]  . CARDIAC CATHETERIZATION  10/16/2005   normal LV systolic function, MVP with trace to 1+ MR, mild nonobstructive CAD involving LAD 30% prox, 10-20 narrowing in prox LAD (Dr. Bishop Limbo)  . CATARACT EXTRACTION, BILATERAL    . CHOLECYSTECTOMY    . NM [facility]  PERF WALL MOTION  02/2011   lexiscan myoview; perfusion dfect of 5% of LV myocardium, small area of anteroseptal breast attenuation artifact, no reversible ischemia, EF 81%, abnormal but low risk scan  . TONSILLECTOMY     Family History  Problem Relation Age of Onset  . Heart attack Mother 5485  . Pneumonia Father 3488  . Alzheimer's disease Father   . Diabetes Sister 10471  . Heart Problems Brother 4266  . Dementia Sister 7182  . Pneumonia Sister        373 weeks old  . Dementia Sister 3391  . Alzheimer's disease Sister 7385   Social History   Socioeconomic History  . Marital status: Widowed    Spouse name: Not on file  . Number of children: 1  . Years of education: 7812  . Highest education level: Not on file   Occupational History    Employer: RETIRED  Social Needs  . Financial resource strain: Not hard at all  . Food insecurity:    Worry: Never true    Inability: Never true  . Transportation needs:    Medical: No    Non-medical: No  Tobacco Use  . Smoking status: Never Smoker  . Smokeless tobacco: Never Used  Substance and Sexual Activity  . Alcohol use: No  . Drug use: No  . Sexual activity: Never  Lifestyle  . Physical activity:    Days per week: 2 days    Minutes per session: 30 min  . Stress: Only a little  Relationships  . Social connections:    Talks on phone: More than three times a week    Gets together: More than three times a week    Attends religious service: More than 4 times per year    Active member of club or organization: No    Attends meetings of clubs or organizations: Never    Relationship status: Widowed  Other Topics Concern  . Not on file  Social History Narrative   Social History      Diet?       Do you drink/eat things with caffeine?      Marital status?       widowed                             What year were you married? 1949      Do you live in a house, apartment, assisted living, condo, trailer, etc.? home      Is it one or more stories? one      How many persons live in your home? 2      Do you have any pets in your home? (please list) yes, Diamond's dog-Susie and daughter's dog Molly      Highest level of education completed? 12      Current or past profession: [employer]      Do you exercise?              yes                        Type & how often? Chair exercises at [facility] in [place]      Advanced Directives      Do you have a living will? yes      Do you have a DNR form?  If not, do you want to discuss one?      Do you have signed POA/HPOA for forms?  yes      Functional Status      Do you have difficulty bathing or dressing yourself? no      Do you have  difficulty preparing food or eating? No- daughter prepares food now      Do you have difficulty managing your medications? yes      Do you have difficulty managing your finances? yes      Do you have difficulty affording your medications? No-insurance    Tobacco Counseling Counseling given: Not Answered   Clinical Intake:  Pre-visit preparation completed: No  Pain : No/denies pain     Diabetes: No  What is the last grade level you completed in school?: 12th grade  Interpreter Needed?: No  Information entered by :: Tyron Russell, RN   Activities of Daily Living In your present state of health, do you have any difficulty performing the following activities: 04/27/2017  Hearing? Y  Vision? N  Difficulty concentrating or making decisions? Y  Walking or climbing stairs? Y  Dressing or bathing? N  Doing errands, shopping? Y  Preparing Food and eating ? N  Using the Toilet? N  In the past six months, have you accidently leaked urine? N  Do you have problems with loss of bowel control? N  Managing your Medications? Y  Managing your Finances? Y  Housekeeping or managing your Housekeeping? Y  Some recent data might be hidden     Immunizations and Health Maintenance Immunization History  Administered Date(s) Administered  . Influenza-Unspecified 12/20/2012, 02/16/2015  . Pneumococcal Conjugate-13 02/01/2014  . Pneumococcal Polysaccharide-23 01/20/1994  . Tdap 05/07/2011   There are no preventive care reminders to display for this patient.  Patient Care Team: Sharon Seller, NP as PCP - General (Geriatric Medicine)  Indicate any recent Medical Services you may have received from other than [facility] providers in the past year (date may be approximate).     Assessment:   This is a routine wellness examination for Maury.  Hearing/Vision screen Vision Screening Comments: Pt sees eye doctor every year  Dietary issues and exercise activities discussed: Current  Exercise Habits: The patient does not participate in regular exercise at present, Exercise limited by: None identified  Goals    None     Depression Screen PHQ 2/9 Scores 04/27/2017 03/30/2017  PHQ - 2 Score 0 0    Fall Risk Fall Risk  04/27/2017 03/30/2017  Falls in the past year? Yes Yes  Number falls in past yr: 2 or more 2 or more  Injury with Fall? Yes Yes    Is the patient's home free of loose throw rugs in walkways, pet beds, electrical cords, etc?   yes      Grab bars in the bathroom? yes      Handrails on the stairs?   yes      Adequate lighting?   yes  Timed Get Up and Go: 20 seconds, fall risk  Cognitive Function: MMSE - Mini Mental State Exam 03/30/2017  Orientation to time 3  Orientation to Place 3  Registration 0  Attention/ Calculation 0  Recall 2  Language- name 2 objects 2  Language- repeat 0  Language- follow 3 step command 3  Language- read & follow direction 1  Write a sentence 1  Copy design 1  Total score 16  Screening Tests Health Maintenance  Topic Date Due  . INFLUENZA VACCINE  08/20/2017  . TETANUS/TDAP  05/06/2021  . DEXA SCAN  Completed  . PNA vac Low Risk Adult  Completed    Qualifies for Shingles Vaccine? Yes, educated and prescription sent to pharmacy  Cancer Screenings: Lung: Low Dose CT Chest recommended if Age 65-80 years, 30 pack-year currently smoking OR have quit w/in 15years. Patient does not qualify. Breast: Up to date on Mammogram? Yes   Up to date of Bone Density/Dexa? Yes Colorectal: up to date  Additional Screenings:  Hepatitis C Screening: declined     Plan:    I have personally reviewed and addressed the Medicare Annual Wellness questionnaire and have noted the following in the patient's chart:  A. Medical and social history B. Use of alcohol, tobacco or illicit drugs  C. Current medications and supplements D. Functional ability and status E.  Nutritional status F.  Physical activity G. Advance  directives H. List of other physicians I.  Hospitalizations, surgeries, and ER visits in previous 12 months J.  Vitals K. Screenings to include hearing, vision, cognitive, depression L. Referrals and appointments - none  In addition, I have reviewed and discussed with patient certain preventive protocols, quality metrics, and best practice recommendations. A written personalized care plan for preventive services as well as general preventive health recommendations were provided to patient.  See attached scanned questionnaire for additional information.   Signed,   Tyron Russell, RN Nurse Health Advisor  Patient Concerns:Bilateral leg redness

## 2017-05-05 ENCOUNTER — Ambulatory Visit (INDEPENDENT_AMBULATORY_CARE_PROVIDER_SITE_OTHER): Payer: PPO | Admitting: Neurology

## 2017-05-05 ENCOUNTER — Encounter: Payer: Self-pay | Admitting: Neurology

## 2017-05-05 VITALS — BP 121/73 | HR 84 | Ht 65.0 in | Wt 117.5 lb

## 2017-05-05 DIAGNOSIS — F03918 Unspecified dementia, unspecified severity, with other behavioral disturbance: Secondary | ICD-10-CM | POA: Insufficient documentation

## 2017-05-05 DIAGNOSIS — F0390 Unspecified dementia without behavioral disturbance: Secondary | ICD-10-CM | POA: Diagnosis not present

## 2017-05-05 DIAGNOSIS — F0391 Unspecified dementia with behavioral disturbance: Secondary | ICD-10-CM | POA: Insufficient documentation

## 2017-05-05 NOTE — Progress Notes (Signed)
PATIENT: Julia Henson DOB: March 01, 1926  Chief Complaint  Patient presents with  . Dementia    New Pt, dgtrRiki Henson  MMSE 14     HISTORICAL  Julia Henson of 84 year old female with, seen in refer by primary care nurse practitioner Sharon Seller, for evaluation of dementia, initial evaluation was on May 05, 2017.  [name]  Reviewed and summarized the referring note, she has history of hypertension, anemia, rectal vault prolapse, chronic kidney disease, osteopenia, depression anxiety  In January 2019, he fell, developed cellulitis in the leg, left, was treated by antibiotics, was referred to [facility] emergency room, during that time, she developed worsening confusion  Now she is back home, lives with her daughter, has good appetite, tends to wake up early 4 AM, mild unsteady gait, but ambulate without assistance, able to dress,  She graduated from high school, retired from Set designer job, significant family history of dementia, 4 siblings suffer dementia, she was started with [place] and Aricept since 2017, but she continue has slow decline of memory loss since 2016,  CT head in February 2019 generalized atrophy supratentorium small vessel disease, there is no acute abnormality  Laboratory evaluation in 2019 showed normal negative B12, RPR CBC CMP, TSH was mildly elevated 6.8   REVIEW OF SYSTEMS: Full 14 system review of systems performed and notable only for depression anxiety, not enough sleep decreased energy memory loss confusion difficulty swallowing, insomnia, itching, trouble swallowing  ALLERGIES: Allergies  Allergen Reactions  . Corn-Containing Products   . Other Hives    Grapes and banana concentrate  . Prilosec [Omeprazole]   . Phenobarbital Rash    rash  . Sulfa Antibiotics Rash    HOME MEDICATIONS: Current Outpatient Medications  Medication Sig Dispense Refill  . aspirin 81 MG tablet Take 81 mg by mouth daily.    Marland Kitchen donepezil (ARICEPT) 10 MG  tablet Take 1 tablet (10 mg total) by mouth at bedtime. 90 tablet 0  . memantine (NAMENDA) 10 MG tablet Take 1 tablet (10 mg total) by mouth 2 (two) times daily. 180 tablet 3  . Multiple Vitamins-Minerals (CENTRUM SILVER PO) Take by mouth.    . Multiple Vitamins-Minerals (EQL PROTECTAVISION PO) Take by mouth daily.    . pravastatin (PRAVACHOL) 40 MG tablet Take 40 mg by mouth daily.    . traZODone (DESYREL) 50 MG tablet Take 0.5 tablets (25 mg total) by mouth at bedtime as needed for sleep. 30 tablet 3  . metoprolol succinate (TOPROL-XL) 25 MG 24 hr tablet Daughter doesn't think she has this     No current facility-administered medications for this visit.     PAST MEDICAL HISTORY: Past Medical History:  Diagnosis Date  . Brittle nails   . CAD (coronary artery disease)    mild by cath in 2010  . Cellulitis   . Cellulitis of left anterior lower leg   . Chronic kidney disease, stage II (mild)   . Dementia with behavioral disturbance   . Depression with anxiety 05/25/2007  . Dyslipidemia   . GERD (gastroesophageal reflux disease)   . Hallucinations   . Head ache   . Hordeolum externum of right eye   . Hypertension   . Leg ulcer, left, limited to breakdown of skin (HCC)   . Mitral insufficiency   . MVP (mitral valve prolapse)   . Occult blood in stools   . Open wound of lower leg, right, sequela   . Open wound of lower leg, right,  subsequent encounter    stage 3 ulcer  . Osteopenia   . Skin lesion of hand   . Urinary frequency   . Urticaria   . Vertigo     PAST SURGICAL HISTORY: Past Surgical History:  Procedure Laterality Date  . APPENDECTOMY    . CARDIAC CATHETERIZATION  06/16/2008   normal LV systolic function, MVP with some mild MR, prox LAD disease (50%) - Dr. Mervyn Skeeters. [facility]  . CARDIAC CATHETERIZATION  10/16/2005   normal LV systolic function, MVP with trace to 1+ MR, mild nonobstructive CAD involving LAD 30% prox, 10-20 narrowing in prox LAD (Dr. Bishop Limbo)  . CATARACT  EXTRACTION, BILATERAL    . CHOLECYSTECTOMY    . NM MYOCAR PERF WALL MOTION  02/2011   lexiscan myoview; perfusion dfect of 5% of LV myocardium, small area of anteroseptal breast attenuation artifact, no reversible ischemia, EF 81%, abnormal but low risk scan  . TONSILLECTOMY      FAMILY HISTORY: Family History  Problem Relation Age of Onset  . Heart attack Mother 71  . Pneumonia Father 76  . Alzheimer's disease Father   . Diabetes Sister 74  . Heart Problems Brother 59  . Dementia Sister 47  . Pneumonia Sister        24 weeks old  . Dementia Sister 44  . Alzheimer's disease Sister 90    SOCIAL HISTORY:  Social History   Socioeconomic History  . Marital status: Widowed    Spouse name: Not on file  . Number of children: 1  . Years of education: 51  . Highest education level: Not on file  Occupational History    Employer: RETIRED  Social Needs  . Financial resource strain: Not hard at all  . Food insecurity:    Worry: Never true    Inability: Never true  . Transportation needs:    Medical: No    Non-medical: No  Tobacco Use  . Smoking status: Never Smoker  . Smokeless tobacco: Never Used  Substance and Sexual Activity  . Alcohol use: No  . Drug use: No  . Sexual activity: Never  Lifestyle  . Physical activity:    Days per week: 2 days    Minutes per session: 30 min  . Stress: Only a little  Relationships  . Social connections:    Talks on phone: More than three times a week    Gets together: More than three times a week    Attends religious service: More than 4 times per year    Active member of club or organization: No    Attends meetings of clubs or organizations: Never    Relationship status: Widowed  . Intimate partner violence:    Fear of current or ex partner: No    Emotionally abused: No    Physically abused: No    Forced sexual activity: No  Other Topics Concern  . Not on file  Social History Narrative   Social History      Diet?       Do  you drink/eat things with caffeine?  Coffee, once daily      Marital status?       widowed                             What year were you married? 1949   Children- 1   Do you live in a house, apartment, assisted living, condo, trailer, etc.?  05/05/17 living with dgtr, Ephraim [place]      Is it one or more stories? one      How many persons live in your home? 2      Do you have any pets in your home? (please list) yes, Verlena's dog-Susie and daughter's dog Molly      Highest level of education completed? 12      Current or past profession: [employer]      Do you exercise?              yes                        Type & how often? Chair exercises at [employer] in [place]      Advanced Directives      Do you have a living will? yes      Do you have a DNR form?                                  If not, do you want to discuss one?      Do you have signed POA/HPOA for forms?  yes      Functional Status      Do you have difficulty bathing or dressing yourself? no      Do you have difficulty preparing food or eating? No- daughter prepares food now      Do you have difficulty managing your medications? yes      Do you have difficulty managing your finances? yes      Do you have difficulty affording your medications? No-insurance     PHYSICAL EXAM   Vitals:   05/05/17 0721  BP: 121/73  Pulse: 84  Weight: 117 lb 8 oz (53.3 kg)  Height: 5\' 5"  (1.651 m)    Not recorded      Body mass index is 19.55 kg/m.  PHYSICAL EXAMNIATION:  Gen: NAD, conversant, well nourised, obese, well groomed                     Cardiovascular: Regular rate rhythm, no peripheral edema, warm, nontender. Eyes: Conjunctivae clear without exudates or hemorrhage Neck: Supple, no carotid bruits. Pulmonary: Clear to auscultation bilaterally   NEUROLOGICAL EXAM:  MENTAL STATUS: MMSE - Mini Mental State Exam 05/05/2017 03/30/2017  Orientation to time 0 3  Orientation to Place 4  3  Registration 2 0  Attention/ Calculation 1 0  Recall 0 2  Language- name 2 objects 2 2  Language- repeat 0 0  Language- follow 3 step command 3 3  Language- read & follow direction 1 1  Write a sentence 0 1  Copy design 1 1  Total score 14 16  Animal naming 7   CRANIAL NERVES: CN II: Visual fields are full to confrontation.  Pupils are round equal and briskly reactive to light. CN III, IV, VI: extraocular movement are normal. No ptosis. CN V: Facial sensation is intact to pinprick in all 3 divisions bilaterally. Corneal responses are intact.  CN VII: Face is symmetric with normal eye closure and smile. CN VIII: Hearing is normal to rubbing fingers CN IX, X: Palate elevates symmetrically. Phonation is normal. CN XI: Head turning and shoulder shrug are intact CN XII: Tongue is midline with normal movements and no atrophy.  MOTOR: There is no pronator drift of out-stretched arms. Muscle  bulk and tone are normal. Muscle strength is normal.  REFLEXES: Reflexes are 2+ and symmetric at the biceps, triceps, knees, and ankles. Plantar responses are flexor.  SENSORY: Intact to light touch, pinprick, positional sensation and vibratory sensation are intact in fingers and toes.  COORDINATION: Rapid alternating movements and fine finger movements are intact. There is no dysmetria on finger-to-nose and heel-knee-shin.    GAIT/STANCE: Mildly unsteady wide-based cautious gait  DIAGNOSTIC DATA (LABS, IMAGING, TESTING) - I reviewed patient records, labs, notes, testing and imaging myself where available.   ASSESSMENT AND PLAN  ENNIFER HARSTON is a 84 y.o. female    Dementia  Central nervous system degenerative disorder  Mini-Mental status examination is 14/30  Continue Aricept, Namenda  Gait abnormality  Refer to home physical therapy    Levert Feinstein, M.D. Ph.D.  [facility] [address], [address] [place], [place] [address] Ph: [phone] Fax:  [phone]  CC: Sharon Seller, NP

## 2017-05-05 NOTE — Patient Instructions (Signed)
Melatonin

## 2017-05-12 ENCOUNTER — Ambulatory Visit: Payer: PPO | Admitting: Podiatry

## 2017-05-12 DIAGNOSIS — M79675 Pain in left toe(s): Secondary | ICD-10-CM | POA: Diagnosis not present

## 2017-05-12 DIAGNOSIS — B351 Tinea unguium: Secondary | ICD-10-CM | POA: Diagnosis not present

## 2017-05-12 DIAGNOSIS — M79674 Pain in right toe(s): Secondary | ICD-10-CM

## 2017-05-13 NOTE — Progress Notes (Signed)
Subjective:   Patient ID: Julia Henson, female   DOB: 84 y.o.   MRN: [id number]   HPI 84 year old female presents the office today for concerns of thick, painful, elongated toenails that she cannot trim herself.  They do cause tenderness with pressure in shoes.  Denies any redness or drainage from the toenail sites.  No other concerns.  She has recently been hospitalized for wound of the left leg and had an infection but she states these are healed.   Review of Systems  All other systems reviewed and are negative.  Past Medical History:  Diagnosis Date  . Brittle nails   . CAD (coronary artery disease)    mild by cath in 2010  . Cellulitis   . Cellulitis of left anterior lower leg   . Chronic kidney disease, stage II (mild)   . Dementia with behavioral disturbance   . Depression with anxiety 05/25/2007  . Dyslipidemia   . GERD (gastroesophageal reflux disease)   . Hallucinations   . Head ache   . Hordeolum externum of right eye   . Hypertension   . Leg ulcer, left, limited to breakdown of skin (HCC)   . Mitral insufficiency   . MVP (mitral valve prolapse)   . Occult blood in stools   . Open wound of lower leg, right, sequela   . Open wound of lower leg, right, subsequent encounter    stage 3 ulcer  . Osteopenia   . Skin lesion of hand   . Urinary frequency   . Urticaria   . Vertigo     Past Surgical History:  Procedure Laterality Date  . APPENDECTOMY    . CARDIAC CATHETERIZATION  06/16/2008   normal LV systolic function, MVP with some mild MR, prox LAD disease (50%) - Dr. Mervyn Skeeters. [facility]  . CARDIAC CATHETERIZATION  10/16/2005   normal LV systolic function, MVP with trace to 1+ MR, mild nonobstructive CAD involving LAD 30% prox, 10-20 narrowing in prox LAD (Dr. Bishop Limbo)  . CATARACT EXTRACTION, BILATERAL    . CHOLECYSTECTOMY    . NM MYOCAR PERF WALL MOTION  02/2011   lexiscan myoview; perfusion dfect of 5% of LV myocardium, small area of anteroseptal breast attenuation  artifact, no reversible ischemia, EF 81%, abnormal but low risk scan  . TONSILLECTOMY       Current Outpatient Medications:  .  aspirin 81 MG tablet, Take 81 mg by mouth daily., Disp: , Rfl:  .  donepezil (ARICEPT) 10 MG tablet, Take 1 tablet (10 mg total) by mouth at bedtime., Disp: 90 tablet, Rfl: 0 .  memantine (NAMENDA) 10 MG tablet, Take 1 tablet (10 mg total) by mouth 2 (two) times daily., Disp: 180 tablet, Rfl: 3 .  metoprolol succinate (TOPROL-XL) 25 MG 24 hr tablet, Daughter doesn't think she has this, Disp: , Rfl:  .  Multiple Vitamins-Minerals (CENTRUM SILVER PO), Take by mouth., Disp: , Rfl:  .  Multiple Vitamins-Minerals (EQL PROTECTAVISION PO), Take by mouth daily., Disp: , Rfl:  .  pravastatin (PRAVACHOL) 40 MG tablet, Take 40 mg by mouth daily., Disp: , Rfl:  .  traZODone (DESYREL) 50 MG tablet, Take 0.5 tablets (25 mg total) by mouth at bedtime as needed for sleep., Disp: 30 tablet, Rfl: 3  Allergies  Allergen Reactions  . Corn-Containing Products   . Other Hives    Grapes and banana concentrate  . Prilosec [Omeprazole]   . Phenobarbital Rash    rash  . Sulfa Antibiotics Rash  Social History   Socioeconomic History  . Marital status: Widowed    Spouse name: Not on file  . Number of children: 1  . Years of education: 8212  . Highest education level: Not on file  Occupational History    Employer: RETIRED  Social Needs  . Financial resource strain: Not hard at all  . Food insecurity:    Worry: Never true    Inability: Never true  . Transportation needs:    Medical: No    Non-medical: No  Tobacco Use  . Smoking status: Never Smoker  . Smokeless tobacco: Never Used  Substance and Sexual Activity  . Alcohol use: No  . Drug use: No  . Sexual activity: Never  Lifestyle  . Physical activity:    Days per week: 2 days    Minutes per session: 30 min  . Stress: Only a little  Relationships  . Social connections:    Talks on phone: More than three times a  week    Gets together: More than three times a week    Attends religious service: More than 4 times per year    Active member of club or organization: No    Attends meetings of clubs or organizations: Never    Relationship status: Widowed  . Intimate partner violence:    Fear of current or ex partner: No    Emotionally abused: No    Physically abused: No    Forced sexual activity: No  Other Topics Concern  . Not on file  Social History Narrative   Social History      Diet?       Do you drink/eat things with caffeine?  Coffee, once daily      Marital status?       widowed                             What year were you married? 1949   Children- 1   Do you live in a house, apartment, assisted living, condo, trailer, etc.?    05/05/17 living with dgtr, [place]raim [place]      Is it one or more stories? one      How many persons live in your home? 2      Do you have any pets in your home? (please list) yes, Jessina's dog-Susie and daughter's dog Molly      Highest level of education completed? 12      Current or past profession: [employer]      Do you exercise?              yes                        Type & how often? Chair exercises at [facility] in [place]      Advanced Directives      Do you have a living will? yes      Do you have a DNR form?                                  If not, do you want to discuss one?      Do you have signed POA/HPOA for forms?  yes      Functional Status      Do you have difficulty bathing or dressing yourself? no  Do you have difficulty preparing food or eating? No- daughter prepares food now      Do you have difficulty managing your medications? yes      Do you have difficulty managing your finances? yes      Do you have difficulty affording your medications? No-insurance        Objective:  Physical Exam  General: NAD-presents with caregiver.  Dermatological: Nails are hypertrophic, dystrophic, brittle,  discolored, elongated 10. No surrounding redness or drainage. Tenderness nails 1-5 bilaterally.  Chronic venous insufficiency changes present there is no open sores identified in the left wrist The skin is very fragile.  No open lesions or pre-ulcerative lesions are identified today.   Vascular: Dorsalis Pedis artery and Posterior Tibial artery pedal pulses are 2/4 bilateral with immedate capillary fill time. There is no pain with calf compression, warmth, erythema.   Neruologic: Grossly intact via light touch bilateral.  Protective threshold with Semmes Wienstein monofilament intact to all pedal sites bilateral.   Musculoskeletal: No gross boney pedal deformities bilateral. No pain, crepitus, or limitation noted with foot and ankle range of motion bilateral. Muscular strength 5/5 in all groups tested bilateral.     Assessment:   Symptomatic onychomycosis    Plan:  -Treatment options discussed including all alternatives, risks, and complications -Etiology of symptoms were discussed -Nails debrided 10 without complications or bleeding.  Discussed treatment options for nail fungus other caregivers request however I do not think that doing medications may be beneficial for her at this point.  Recommended periodic debridement. -Daily foot inspection -Follow-up in 3 months or sooner if any problems arise. In the meantime, encouraged to call the office with any questions, concerns, change in symptoms.   Ovid Curd, DPM

## 2017-05-26 ENCOUNTER — Inpatient Hospital Stay
Admission: RE | Admit: 2017-05-26 | Discharge: 2017-05-26 | Disposition: A | Discharge status: Home / Self Care | Payer: Self-pay | Source: Ambulatory Visit | Attending: Nurse Practitioner | Admitting: Nurse Practitioner

## 2017-06-24 IMAGING — CR DG LUMBAR SPINE 2-3V
3 series · 3 of 3 positions shown · non-contrast
Comparison: None.

CLINICAL DATA: Back pain.  Initial evaluation .

EXAM:
LUMBAR SPINE - 2-3 VIEW

[w lumbar spine ap]
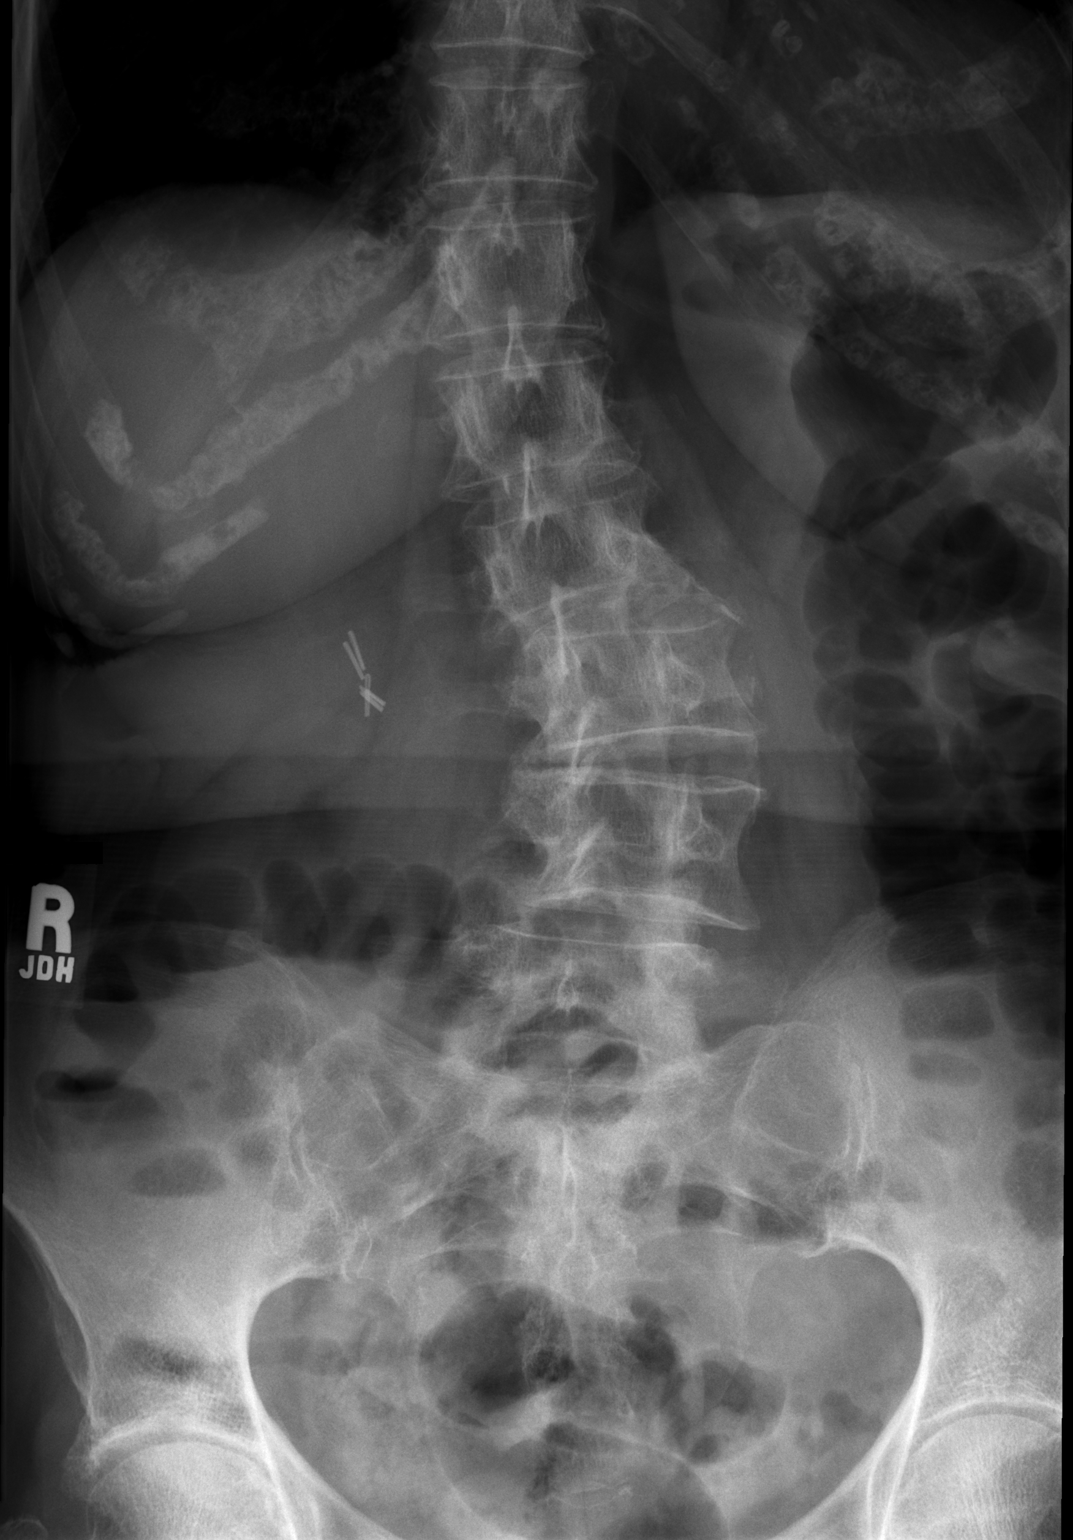

[w lumbar spine lat]
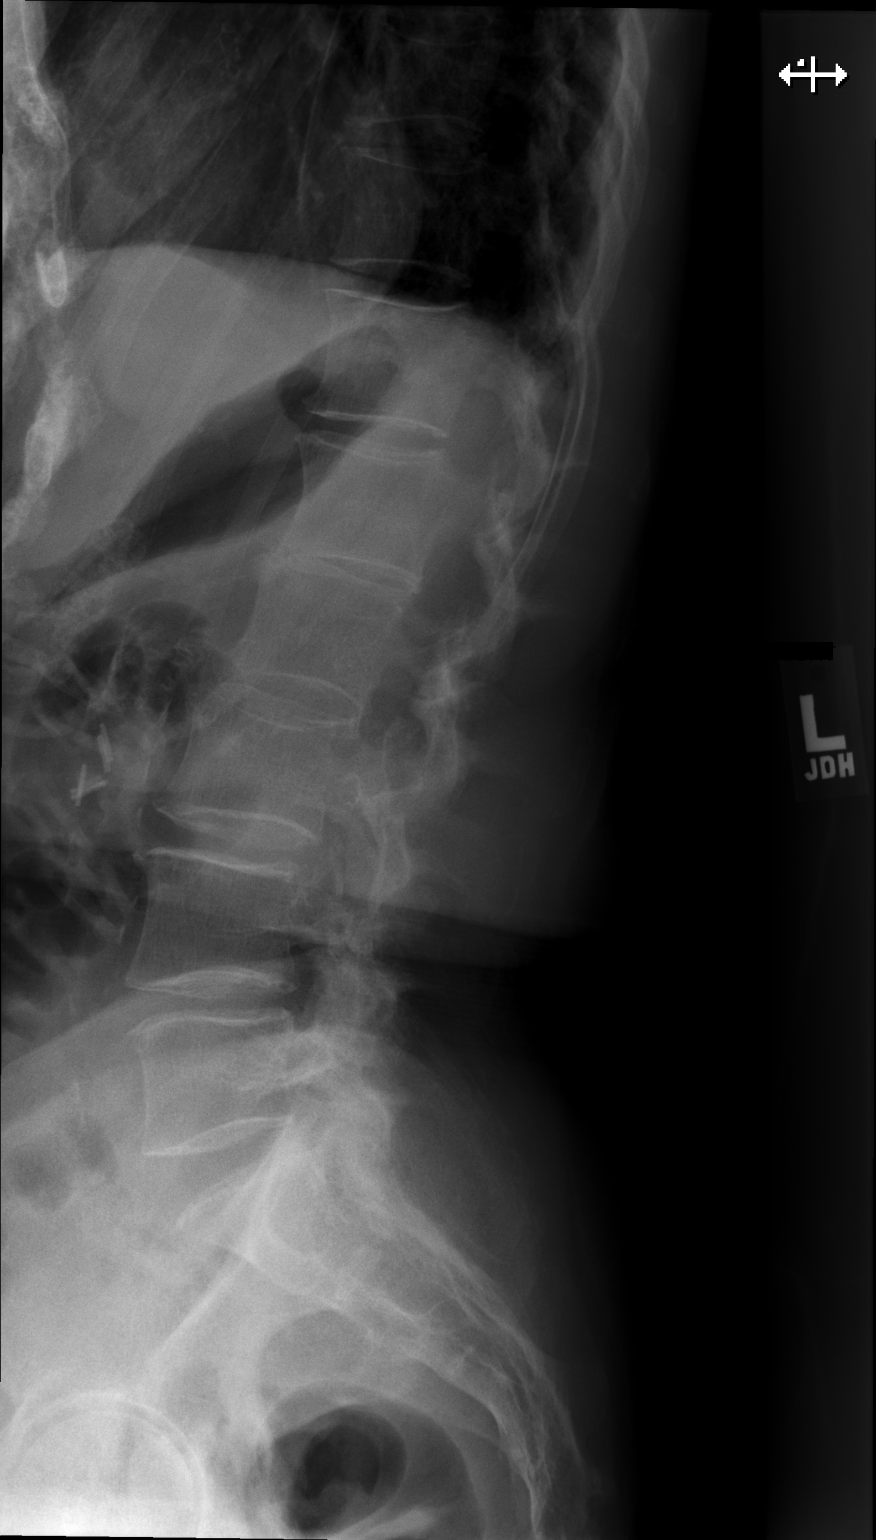

[w lumbar l-5 s-1 spot]
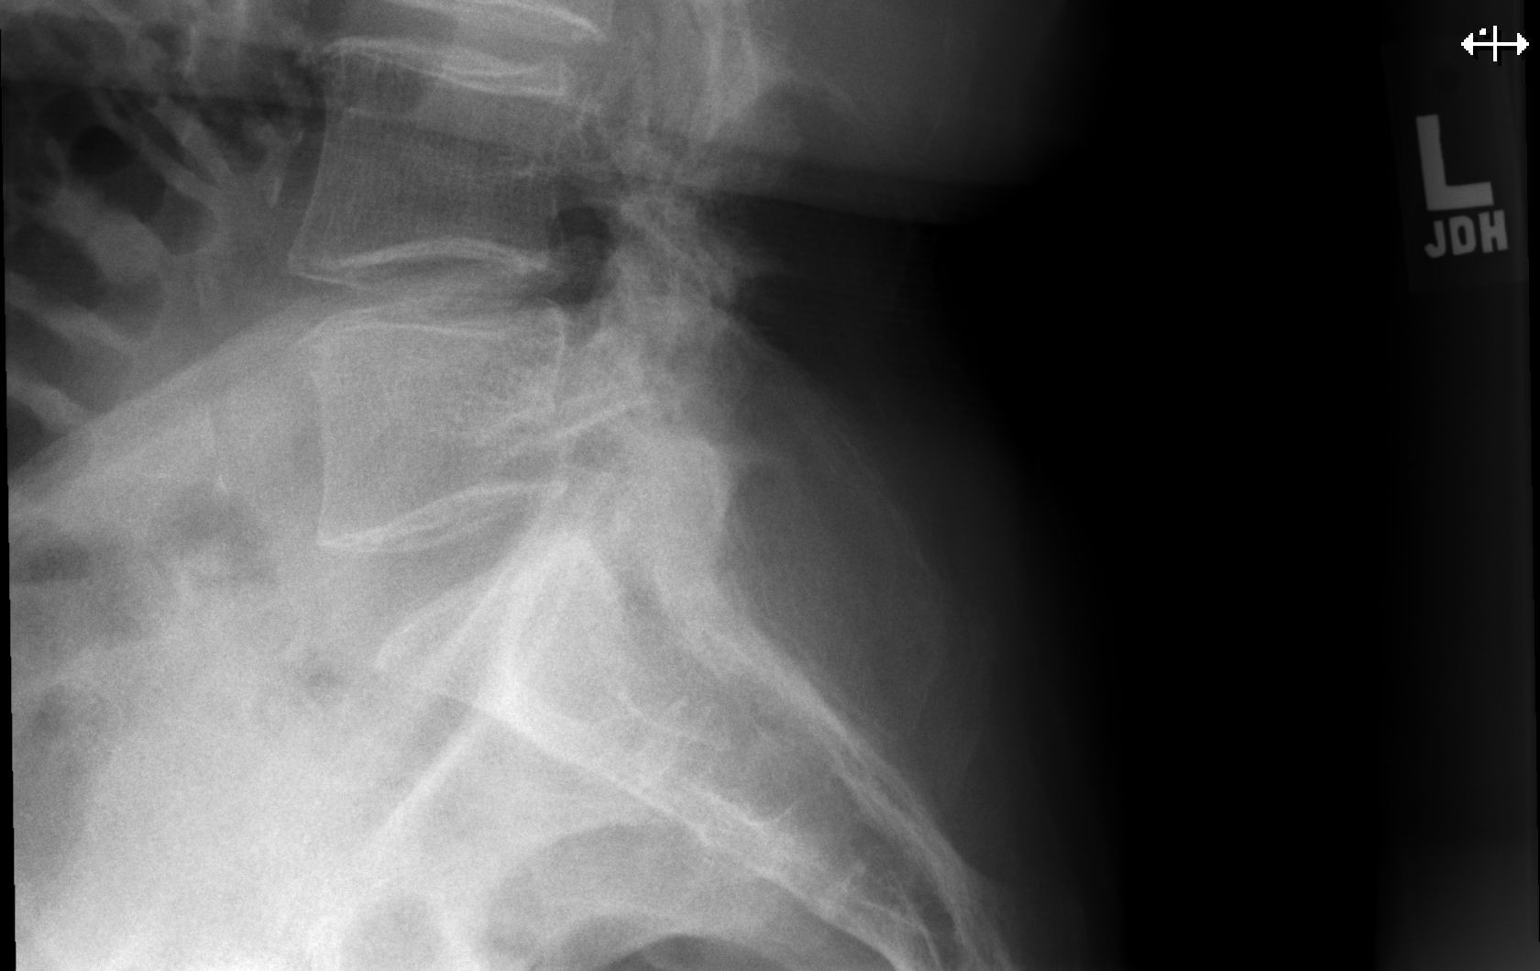

[3 of 3 positions shown; findings below may reference images not displayed]

FINDINGS: Severe scoliosis lumbar spine concave right. Diffuse multilevel
degenerative change. No acute bony abnormality. Aortoiliac
atherosclerotic vascular calcification. Surgical clips right upper
quadrant.
IMPRESSION: 1.  Severe lumbar scoliosis and multilevel degenerative change.

2.  Peripheral vascular disease.

## 2017-06-24 IMAGING — CR DG THORACIC SPINE 3V
3 series · 3 of 3 positions shown · non-contrast
Comparison: Chest radiographs 06/14/2008

CLINICAL DATA: Mid thoracic and low back pain for 6 months, no
known injury, history coronary artery disease, hypertension

EXAM:
THORACIC SPINE - 2 VIEW + SWIMMERS

[w thoracic spine ap]
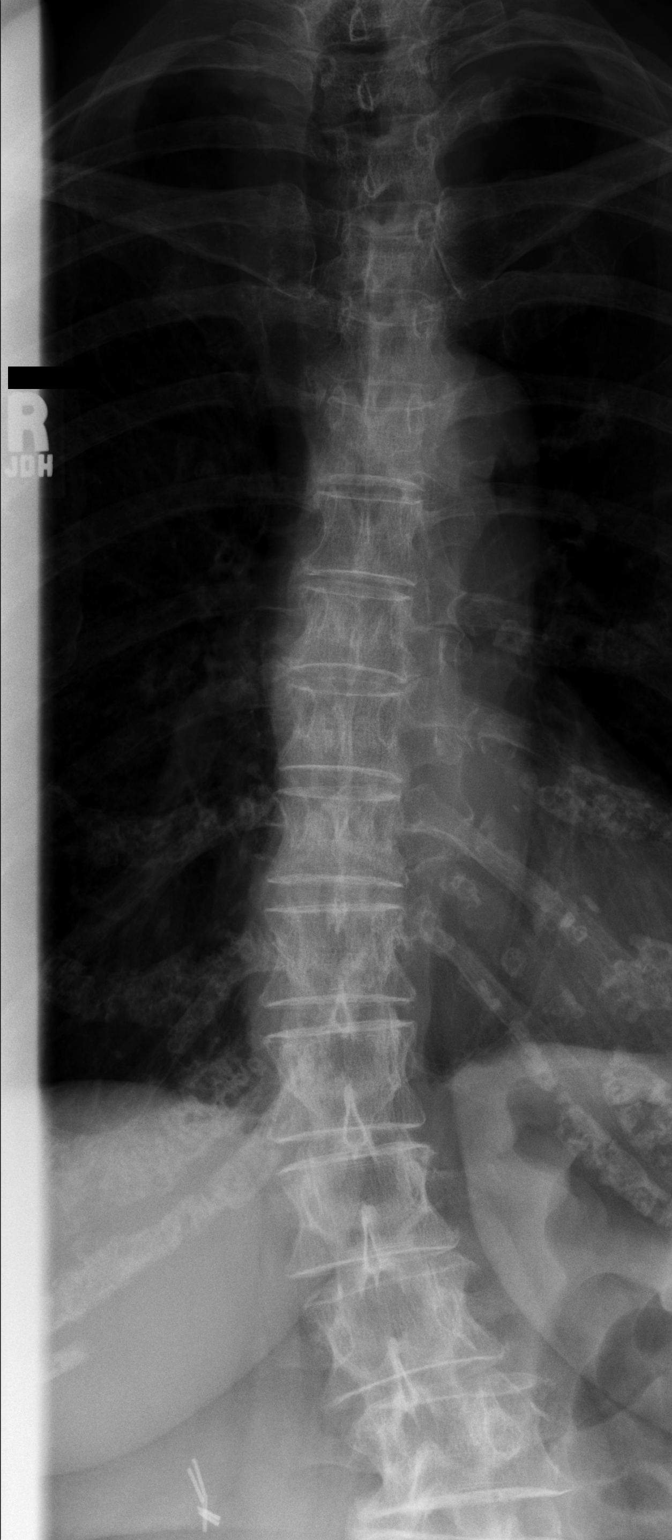

[w thoracic spine lat]
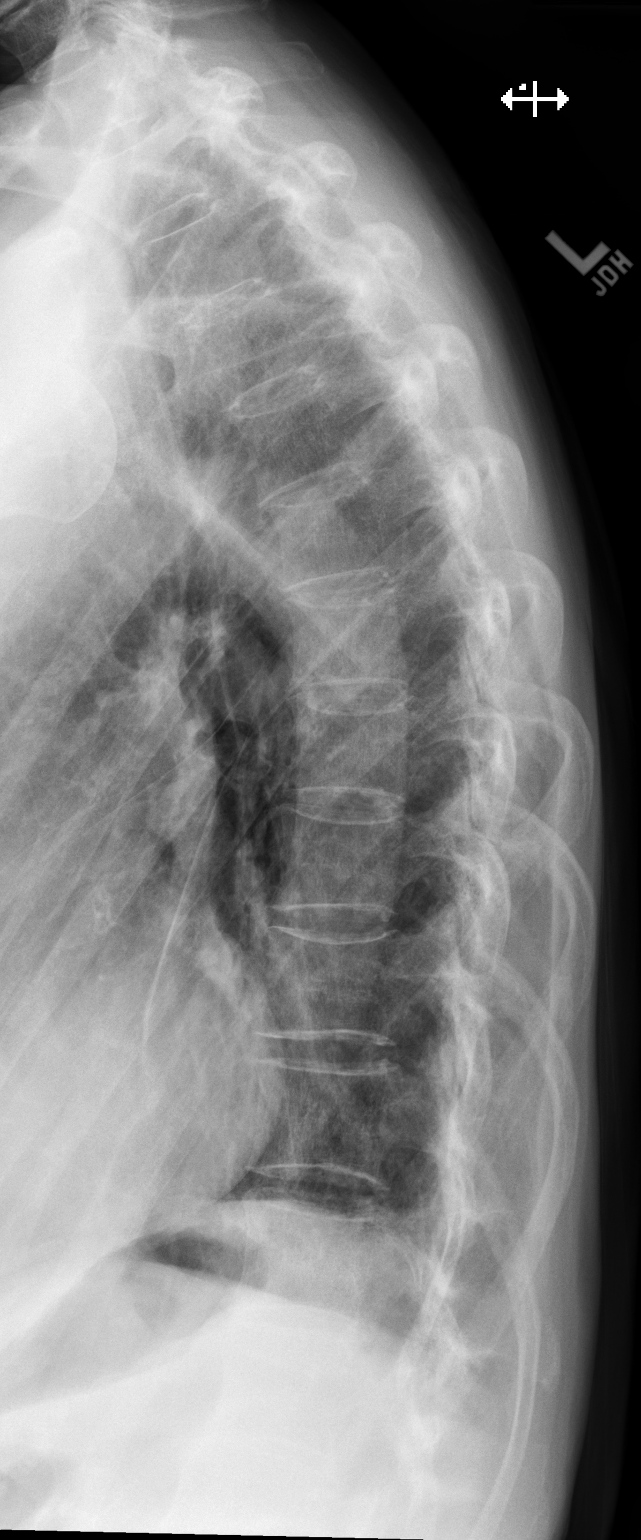

[w thoracic swimmers]
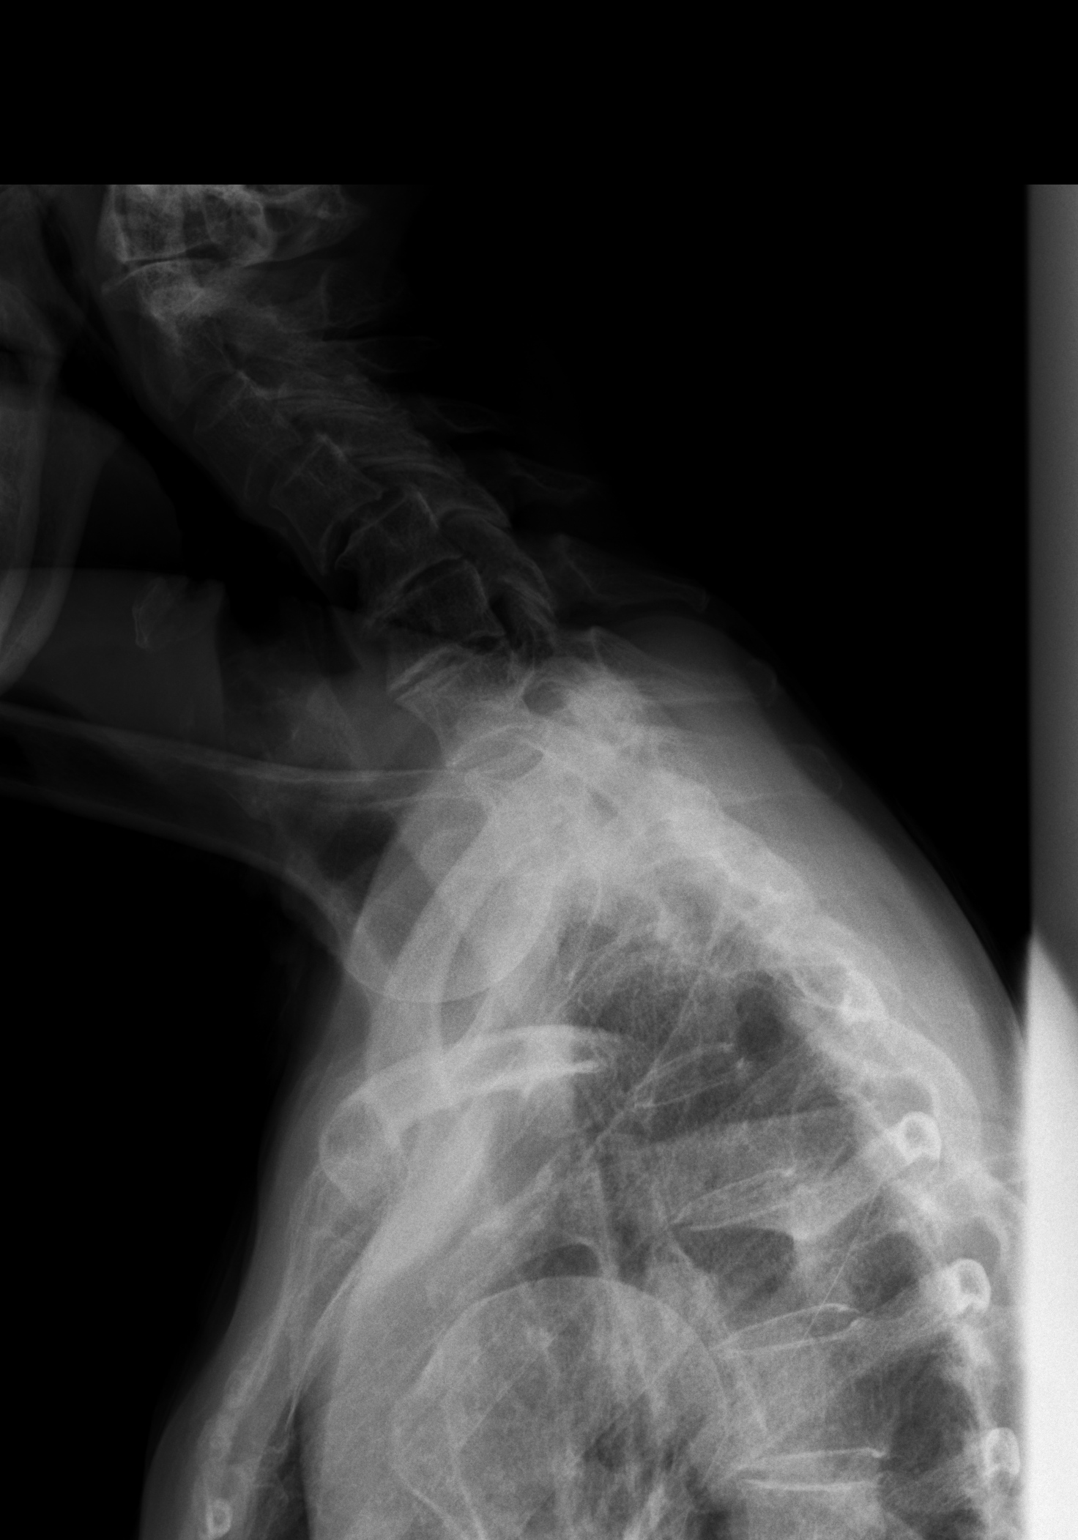

[3 of 3 positions shown; findings below may reference images not displayed]

FINDINGS: Osseous demineralization.

Eleven pairs of ribs.

Uncertain if a hypoplastic LEFT twelfth rib is present.

Broad based dextro convex thoracolumbar scoliosis.

Vertebral body heights maintained without fracture or subluxation.

No bone destruction.

Scattered costal cartilaginous calcifications.

Visualized posterior ribs intact.
IMPRESSION: Osseous demineralization with thoracolumbar scoliosis.

No acute abnormalities.

## 2017-07-01 ENCOUNTER — Inpatient Hospital Stay
Admission: RE | Admit: 2017-07-01 | Discharge: 2017-07-01 | Disposition: A | Discharge status: Home / Self Care | Payer: Self-pay | Source: Ambulatory Visit | Attending: Nurse Practitioner | Admitting: Nurse Practitioner

## 2017-07-10 ENCOUNTER — Other Ambulatory Visit: Payer: Self-pay

## 2017-07-27 ENCOUNTER — Encounter: Payer: Self-pay | Admitting: Internal Medicine

## 2017-07-27 ENCOUNTER — Ambulatory Visit (INDEPENDENT_AMBULATORY_CARE_PROVIDER_SITE_OTHER): Payer: PPO | Admitting: Internal Medicine

## 2017-07-27 VITALS — BP 140/70 | HR 60 | Temp 97.4°F | Ht 65.0 in | Wt 120.0 lb

## 2017-07-27 DIAGNOSIS — I2510 Atherosclerotic heart disease of native coronary artery without angina pectoris: Secondary | ICD-10-CM | POA: Diagnosis not present

## 2017-07-27 DIAGNOSIS — I2583 Coronary atherosclerosis due to lipid rich plaque: Secondary | ICD-10-CM

## 2017-07-27 DIAGNOSIS — I10 Essential (primary) hypertension: Secondary | ICD-10-CM

## 2017-07-27 DIAGNOSIS — F0391 Unspecified dementia with behavioral disturbance: Secondary | ICD-10-CM | POA: Diagnosis not present

## 2017-07-27 DIAGNOSIS — G4700 Insomnia, unspecified: Secondary | ICD-10-CM | POA: Insufficient documentation

## 2017-07-27 DIAGNOSIS — M8580 Other specified disorders of bone density and structure, unspecified site: Secondary | ICD-10-CM

## 2017-07-27 DIAGNOSIS — D692 Other nonthrombocytopenic purpura: Secondary | ICD-10-CM | POA: Diagnosis not present

## 2017-07-27 DIAGNOSIS — E785 Hyperlipidemia, unspecified: Secondary | ICD-10-CM | POA: Diagnosis not present

## 2017-07-27 NOTE — Progress Notes (Signed)
Location:  [facility] clinic Provider:  Lorie Cleckley L. Renato Gails, D.O., C.M.D.  Code Status: DNR completed at April appt Goals of Care:  Advanced Directives 07/27/2017  Does Patient Have a Medical Advance Directive? Yes  Type of Advance Directive Out of facility DNR (pink MOST or yellow form)  Does patient want to make changes to medical advance directive? No - Patient declined  Copy of Healthcare Power of Attorney in Chart? -  Pre-existing out of facility DNR order (yellow form or pink MOST form) Yellow form placed in chart (order not valid for inpatient use)   Chief Complaint  Patient presents with  . Medical Management of Chronic Issues    follow-up    HPI: Patient is a 84 y.o. female seen today for medical management of chronic diseases.  She is Jessica's patient.    She was using xanax for sleep when she first came. Was stopped.  She has a h/o CAD, mitral valve prolapse and HTN.  Was on pravachol.  Memory loss--dementia--had been started on aricept 10mg  but stopped when she finished the first supply. She had bene living alone until a hospitalization for cellulitis when she moved in with her daughter.  Father and 3 sisters all had dementia also.  She did have a CT of her brain in 2016 possible with a stroke seen.  She wanders at night and gets more confused at night.  She scored 16/30 on MMSE on 03/30/17.  Since then, she was started on namenda titration.  CT report showed chronic small vessel disease w/o acute intracranial abnormality 03/05/17.  She saw neurology 05/05/17, Dr. Terrace Arabia.  Laboratory evaluation in 2019 showed normal negative B12, RPR CBC CMP, TSH was mildly elevated 6.8.  She scored 14/30 at the neurologist.  They referred her for PT and continued the aricept and namenda.    She had multiple ulcers on her legs. No more leg wounds.  She bruises easily.  She closed her arm in the door and she got a bruise.    She's had osteopenia and bone density f/u ordered.  appt made and pending  On  trazodone prn for sleep after melatonin only allowed 4 hrs of sleep.  She is resting much better.  She rests mostly.    She's seen podiatry for her thicked toenails and fungus.  Nails were debrided and daily foot inspection recommended with 3 month f/u (seen 4/23).  Goes to a senior center each day.  They play bingo, cards, get meals.  [facility] in [place] for the mornings.  Attendance has dropped.     Past Medical History:  Diagnosis Date  . Brittle nails   . CAD (coronary artery disease)    mild by cath in 2010  . Cellulitis   . Cellulitis of left anterior lower leg   . Chronic kidney disease, stage II (mild)   . Dementia with behavioral disturbance   . Depression with anxiety 05/25/2007  . Dyslipidemia   . GERD (gastroesophageal reflux disease)   . Hallucinations   . Head ache   . Hordeolum externum of right eye   . Hypertension   . Leg ulcer, left, limited to breakdown of skin (HCC)   . Mitral insufficiency   . MVP (mitral valve prolapse)   . Occult blood in stools   . Open wound of lower leg, right, sequela   . Open wound of lower leg, right, subsequent encounter    stage 3 ulcer  . Osteopenia   .  Skin lesion of hand   . Urinary frequency   . Urticaria   . Vertigo     Past Surgical History:  Procedure Laterality Date  . APPENDECTOMY    . CARDIAC CATHETERIZATION  06/16/2008   normal LV systolic function, MVP with some mild MR, prox LAD disease (50%) - Dr. Mervyn SkeetersA. Little  . CARDIAC CATHETERIZATION  10/16/2005   normal LV systolic function, MVP with trace to 1+ MR, mild nonobstructive CAD involving LAD 30% prox, 10-20 narrowing in prox LAD (Dr. Bishop Limbo. [place])  . CATARACT EXTRACTION, BILATERAL    . CHOLECYSTECTOMY    . NM MYOCAR PERF WALL MOTION  02/2011   lexiscan myoview; perfusion dfect of 5% of LV myocardium, small area of anteroseptal breast attenuation artifact, no reversible ischemia, EF 81%, abnormal but low risk scan  . TONSILLECTOMY      Allergies    Allergen Reactions  . Corn-Containing Products   . Other Hives    Grapes and banana concentrate  . Prilosec [Omeprazole]   . Phenobarbital Rash    rash  . Sulfa Antibiotics Rash    Outpatient Encounter Medications as of 07/27/2017  Medication Sig  . aspirin 81 MG tablet Take 81 mg by mouth daily.  Marland Kitchen. donepezil (ARICEPT) 10 MG tablet Take 1 tablet (10 mg total) by mouth at bedtime.  . memantine (NAMENDA) 10 MG tablet Take 1 tablet (10 mg total) by mouth 2 (two) times daily.  . metoprolol succinate (TOPROL-XL) 25 MG 24 hr tablet Daughter doesn't think she has this  . Multiple Vitamins-Minerals (CENTRUM SILVER PO) Take 1 tablet by mouth daily.   . Multiple Vitamins-Minerals (EQL PROTECTAVISION PO) Take by mouth daily.  . pravastatin (PRAVACHOL) 40 MG tablet Take 40 mg by mouth daily.  . traZODone (DESYREL) 50 MG tablet Take 0.5 tablets (25 mg total) by mouth at bedtime as needed for sleep.   No facility-administered encounter medications on file as of 07/27/2017.     Review of Systems:  Review of Systems  Constitutional: Negative for chills, fever and malaise/fatigue.  HENT: Negative for congestion and hearing loss.        Hears well  Eyes: Negative for blurred vision.       Sees well  Respiratory: Negative for cough and shortness of breath.   Cardiovascular: Negative for chest pain, palpitations, orthopnea, claudication and leg swelling.  Gastrointestinal: Negative for abdominal pain, blood in stool, constipation, diarrhea and melena.  Genitourinary: Negative for dysuria.       No incontinence  Musculoskeletal: Negative for falls.  Skin:       Bruises very easily  Neurological: Negative for dizziness and loss of consciousness.  Endo/Heme/Allergies: Bruises/bleeds easily.  Psychiatric/Behavioral: Positive for memory loss. Negative for depression. The patient is not nervous/anxious and does not have insomnia.        Insomnia better    Health Maintenance  Topic Date Due  .  INFLUENZA VACCINE  08/20/2017  . TETANUS/TDAP  05/06/2021  . DEXA SCAN  Completed  . PNA vac Low Risk Adult  Completed    Physical Exam: Vitals:   07/27/17 1405  BP: 140/70  Pulse: 60  Temp: (!) 97.4 F (36.3 C)  TempSrc: Oral  SpO2: 95%  Weight: 120 lb (54.4 kg)  Height: 5\' 5"  (1.651 m)   Body mass index is 19.97 kg/m. Physical Exam  Constitutional: She is oriented to person, place, and time. No distress.  Thin female  Cardiovascular: Normal rate, regular rhythm, normal heart sounds  and intact distal pulses.  Pulmonary/Chest: Effort normal and breath sounds normal. No respiratory distress.  Abdominal: Soft. Bowel sounds are normal.  Musculoskeletal: Normal range of motion.  Walks w/o assistive device  Neurological: She is alert and oriented to person, place, and time.  Skin: Skin is warm and dry.  Ecchymoses of left arm is large circle of forearm; others on arms and legs also small  Psychiatric: She has a normal mood and affect.    Labs reviewed: Basic Metabolic Panel: Recent Labs    03/03/17 03/05/17 03/30/17 1431  NA 137 139 140  K 3.4 3.4 4.2  CL  --   --  101  CO2  --   --  32  GLUCOSE  --   --  140*  BUN 28* 26* 20  CREATININE 0.5 0.5 0.83  CALCIUM  --   --  9.6  TSH  --   --  6.79*   Liver Function Tests: Recent Labs    08/27/16 09/01/16 03/05/17 03/30/17 1431  AST 19 19 54* 22  ALT 12 12 39* 21  ALKPHOS 74 74 86  --   BILITOT  --   --   --  0.3  PROT  --   --   --  7.0   No results for input(s): LIPASE, AMYLASE in the last 8760 hours. No results for input(s): AMMONIA in the last 8760 hours. CBC: Recent Labs    02/24/17 03/05/17 03/30/17 1431  WBC 4.6  4.6 5.4 7.0  NEUTROABS  --   --  4,711  HGB 12.6  12.6 12.8 12.7  HCT 38  38 39 37.1  MCV  --   --  89.0  PLT 250 259 256   Lipid Panel: Recent Labs    08/27/16 09/01/16 03/07/17  CHOL 218* 218* 146  HDL 65 65 77*  LDLCALC 136 136 61  TRIG 87 87 42   Assessment/Plan 1. Dementia  with behavioral disturbance, unspecified dementia type -was wandering at night, but this is better as she's sleeping through the night with the trazodone -doing well on aricept and namenda without side effects -eating well  2. Osteopenia, unspecified location -f/u bone density pending -on MVI, but probably should be on vitamin D (noted after patient left office)  3. Insomnia, unspecified type -cont trazodone at hs which has been effective  4. Essential hypertension -bp at goal with toprol xl alone  5. Dyslipidemia -lipids at goal with pravachol (LDL had been 136 before)  6. Senile purpura (HCC) -ongoing--educated on term, effect of age, thinning skin with decreased elasticity and collagen plus asa use  7. Coronary artery disease due to lipid rich plaque -no symptoms, noted historically and went to cardiology routinely for checkups but no longer doing this given no changes--cont to monitor here with bp and lipid control, baby asa  Labs/tests ordered:  No orders of the defined types were placed in this encounter. labs done today except lipids as she'd eaten  Next appt:  11/30/2017 with Shanda Bumps  Kebron Pulse L. Alfreddie Consalvo, D.O. Geriatrics [facility] [address], [place] [address] Cell Phone (Mon-Fri 8am-5pm):  [phone] On Call:  [phone] & follow prompts after 5pm & weekends Office Phone:  [phone] Office Fax:  [phone]

## 2017-08-11 ENCOUNTER — Ambulatory Visit (INDEPENDENT_AMBULATORY_CARE_PROVIDER_SITE_OTHER): Payer: PPO | Admitting: Podiatry

## 2017-08-11 DIAGNOSIS — M79674 Pain in right toe(s): Secondary | ICD-10-CM | POA: Diagnosis not present

## 2017-08-11 DIAGNOSIS — B351 Tinea unguium: Secondary | ICD-10-CM

## 2017-08-11 DIAGNOSIS — M79675 Pain in left toe(s): Secondary | ICD-10-CM

## 2017-08-12 NOTE — Progress Notes (Signed)
Subjective: 84 y.o. returns the office today for painful, elongated, thickened toenails which she cannot trim herself. Denies any redness or drainage around the nails. Denies any acute changes since last appointment and no new complaints today. Denies any systemic complaints such as fevers, chills, nausea, vomiting.   PCP: Sharon SellerEubanks, Jessica K, NP   Objective: AAO 3, NAD DP/PT pulses palpable, CRT less than 3 seconds Nails hypertrophic, dystrophic, elongated, brittle, discolored 10. There is tenderness overlying the nails 1-5 bilaterally. There is no surrounding erythema or drainage along the nail sites. No open lesions or pre-ulcerative lesions are identified. No other areas of tenderness bilateral lower extremities. No overlying edema, erythema, increased warmth. No pain with calf compression, swelling, warmth, erythema.  Assessment: Patient presents with symptomatic onychomycosis  Plan: -Treatment options including alternatives, risks, complications were discussed -Nails sharply debrided 10 without complication/bleeding. -Discussed daily foot inspection. If there are any changes, to call the office immediately.  -Follow-up in 3 months or sooner if any problems are to arise. In the meantime, encouraged to call the office with any questions, concerns, changes symptoms.  Ovid CurdMatthew Austan Nicholl, DPM

## 2017-08-19 ENCOUNTER — Ambulatory Visit
Admission: RE | Admit: 2017-08-19 | Discharge: 2017-08-19 | Disposition: A | Discharge status: Home / Self Care | Payer: PPO | Source: Ambulatory Visit | Attending: Nurse Practitioner | Admitting: Nurse Practitioner

## 2017-08-19 DIAGNOSIS — M8589 Other specified disorders of bone density and structure, multiple sites: Secondary | ICD-10-CM | POA: Diagnosis not present

## 2017-08-19 DIAGNOSIS — Z780 Asymptomatic menopausal state: Secondary | ICD-10-CM | POA: Diagnosis not present

## 2017-08-19 DIAGNOSIS — M8580 Other specified disorders of bone density and structure, unspecified site: Secondary | ICD-10-CM

## 2017-08-21 ENCOUNTER — Telehealth: Payer: Self-pay

## 2017-08-21 NOTE — Telephone Encounter (Signed)
Patient's daughter verbalized understanding. She agreed to have patient start medication. Medication list has been updated.

## 2017-08-21 NOTE — Telephone Encounter (Signed)
-----   Message from Kermit Baloiffany L Reed, DO sent at 08/20/2017 11:47 AM EDT ----- Notify her daughter:  She has osteopenia, low bone mass but not to the severity of osteoporosis.  Previously, Shanda BumpsJessica had recommended caltrate 600mg  with D 400 units one tablet bid.  I agree she should take this--it's not on her current med list.

## 2017-11-11 ENCOUNTER — Ambulatory Visit: Payer: PPO | Admitting: Podiatry

## 2017-11-11 ENCOUNTER — Encounter: Payer: Self-pay | Admitting: Podiatry

## 2017-11-11 DIAGNOSIS — M79674 Pain in right toe(s): Secondary | ICD-10-CM

## 2017-11-11 DIAGNOSIS — B351 Tinea unguium: Secondary | ICD-10-CM | POA: Diagnosis not present

## 2017-11-11 DIAGNOSIS — M79675 Pain in left toe(s): Secondary | ICD-10-CM

## 2017-11-23 NOTE — Progress Notes (Signed)
Subjective:  "These toenails can really get out of hand."  Julia Henson presents today with diabetes for follow up of painful, mycotic toenails which she cannot cut herself.  Pain is relieved with periodic professional debridement.  Objective: Vascular Examination: Capillary refill time <3 seconds x 10 digits Dorsalis pedis and posterior tibial pulses present b/l No digital hair x 10 digits Skin temperature warm to warm b/l  Dermatological Examination: Skin with normal turgor, texture and tone b/l Toenails 1-5 b/l discolored, thick, dystrophic with subungual debris and pain with palpation to nailbeds due to thickness of nails. No open wounds No interdigital macerations No hyperkeratotic lesions  Musculoskeletal: Muscle strength 5/5 to all LE muscle groups  Neurological: Sensation intact with 10 gram monofilament. Vibratory sensation intact.  Assessment: Painful onychomycosis toenails 1-5 b/l   Plan: 1. Toenails 1-5 b/l were debrided in length and girth without iatrogenic bleeding. 2. Patient to continue soft, supportive shoe gear 3. Patient to report any pedal injuries to medical professional immediately. 4. Follow up 3 months. Patient/POA to call should there be a concern in the interim.

## 2017-11-30 ENCOUNTER — Ambulatory Visit: Payer: PPO | Admitting: Nurse Practitioner

## 2017-12-07 ENCOUNTER — Ambulatory Visit: Payer: PPO | Admitting: Nurse Practitioner

## 2017-12-14 ENCOUNTER — Ambulatory Visit (INDEPENDENT_AMBULATORY_CARE_PROVIDER_SITE_OTHER): Payer: PPO | Admitting: Nurse Practitioner

## 2017-12-14 ENCOUNTER — Encounter: Payer: Self-pay | Admitting: Nurse Practitioner

## 2017-12-14 VITALS — BP 128/72 | HR 54 | Temp 97.8°F | Ht 65.0 in | Wt 125.0 lb

## 2017-12-14 DIAGNOSIS — I10 Essential (primary) hypertension: Secondary | ICD-10-CM

## 2017-12-14 DIAGNOSIS — E785 Hyperlipidemia, unspecified: Secondary | ICD-10-CM

## 2017-12-14 DIAGNOSIS — R7989 Other specified abnormal findings of blood chemistry: Secondary | ICD-10-CM | POA: Diagnosis not present

## 2017-12-14 DIAGNOSIS — F0391 Unspecified dementia with behavioral disturbance: Secondary | ICD-10-CM | POA: Diagnosis not present

## 2017-12-14 DIAGNOSIS — Z23 Encounter for immunization: Secondary | ICD-10-CM

## 2017-12-14 DIAGNOSIS — M8580 Other specified disorders of bone density and structure, unspecified site: Secondary | ICD-10-CM | POA: Diagnosis not present

## 2017-12-14 DIAGNOSIS — G4700 Insomnia, unspecified: Secondary | ICD-10-CM | POA: Diagnosis not present

## 2017-12-14 LAB — CBC WITH DIFFERENTIAL/PLATELET
BASOS ABS: 29 {cells}/uL (ref 0–200)
Basophils Relative: 0.5 %
EOS PCT: 2.6 %
Eosinophils Absolute: 148 cells/uL (ref 15–500)
HCT: 40.2 % (ref 35.0–45.0)
Hemoglobin: 13.6 g/dL (ref 11.7–15.5)
Lymphs Abs: 1989 cells/uL (ref 850–3900)
MCH: 30.9 pg (ref 27.0–33.0)
MCHC: 33.8 g/dL (ref 32.0–36.0)
MCV: 91.4 fL (ref 80.0–100.0)
MPV: 9.5 fL (ref 7.5–12.5)
Monocytes Relative: 7.2 %
NEUTROS ABS: 3124 {cells}/uL (ref 1500–7800)
Neutrophils Relative %: 54.8 %
PLATELETS: 209 10*3/uL (ref 140–400)
RBC: 4.4 10*6/uL (ref 3.80–5.10)
RDW: 11.8 % (ref 11.0–15.0)
TOTAL LYMPHOCYTE: 34.9 %
WBC mixed population: 410 cells/uL (ref 200–950)
WBC: 5.7 10*3/uL (ref 3.8–10.8)

## 2017-12-14 LAB — COMPLETE METABOLIC PANEL WITH GFR
AG RATIO: 1.4 (calc) (ref 1.0–2.5)
ALT: 15 U/L (ref 6–29)
AST: 21 U/L (ref 10–35)
Albumin: 4.2 g/dL (ref 3.6–5.1)
Alkaline phosphatase (APISO): 67 U/L (ref 33–130)
BILIRUBIN TOTAL: 0.3 mg/dL (ref 0.2–1.2)
BUN: 15 mg/dL (ref 7–25)
CALCIUM: 9.4 mg/dL (ref 8.6–10.4)
CHLORIDE: 99 mmol/L (ref 98–110)
CO2: 32 mmol/L (ref 20–32)
Creat: 0.84 mg/dL (ref 0.60–0.88)
GFR, EST AFRICAN AMERICAN: 71 mL/min/{1.73_m2} (ref >=60)
GFR, EST NON AFRICAN AMERICAN: 62 mL/min/{1.73_m2} (ref >=60)
GLOBULIN: 2.9 g/dL (ref 1.9–3.7)
Glucose, Bld: 90 mg/dL (ref 65–139)
POTASSIUM: 4 mmol/L (ref 3.5–5.3)
SODIUM: 138 mmol/L (ref 135–146)
Total Protein: 7.1 g/dL (ref 6.1–8.1)

## 2017-12-14 LAB — TSH: TSH: 5.05 mIU/L — ABNORMAL HIGH (ref 0.40–4.50)

## 2017-12-14 MED ORDER — MEMANTINE HCL 10 MG PO TABS: 10.0000 mg | ORAL_TABLET | Freq: Two times a day (BID) | ORAL | 1 refills | Status: DC

## 2017-12-14 MED ORDER — TRAZODONE HCL 50 MG PO TABS: 25.0000 mg | ORAL_TABLET | Freq: Every evening | ORAL | 0 refills | Status: DC | PRN

## 2017-12-14 MED ORDER — METOPROLOL SUCCINATE ER 25 MG PO TB24: 25.0000 mg | ORAL_TABLET | Freq: Every day | ORAL | 1 refills | Status: DC

## 2017-12-14 MED ORDER — PRAVASTATIN SODIUM 40 MG PO TABS: 40.0000 mg | ORAL_TABLET | Freq: Every day | ORAL | 1 refills | Status: DC

## 2017-12-14 MED ORDER — DONEPEZIL HCL 10 MG PO TABS: 10.0000 mg | ORAL_TABLET | Freq: Every day | ORAL | 1 refills | Status: DC

## 2017-12-14 MED ORDER — ZOSTER VAC RECOMB ADJUVANTED 50 MCG/0.5ML IM SUSR: 0.5000 mL | Freq: Once | INTRAMUSCULAR | 0 refills | Status: AC

## 2017-12-14 NOTE — Progress Notes (Signed)
Careteam: Patient Care Team: Sharon Seller, NP as PCP - General (Geriatric Medicine)  Advanced Directive information    Allergies  Allergen Reactions  . Corn-Containing Products   . Other Hives    Grapes and banana concentrate  . Prilosec [Omeprazole]   . Phenobarbital Rash    rash  . Sulfa Antibiotics Rash    Chief Complaint  Patient presents with  . Medical Management of Chronic Issues    4 month follow-up here with daughter. Patient c/o ongoing back pain   . Medication Refill    90 day supply of all medications   . Immunizations    Flu vaccine today, rx for shingrix printed      HPI: Patient is a 84 y.o. female seen in the office today for routine follow up.  Living with her daughter currently. Things are a lot better now. Pt reports she would rather be at home but things are going good.   Dementia- currently on aricept and namenda  Going to center everyday for activities and stimulation. Eating well.  Has gained 5 lbs.   Insomnia- using trazodone 25 mg by mouth as needed, not needing recently.   Osteopenia- dexa updated in July, Osteopenia noted. Recommended to take caltrate with D 600/400 twice a day. Walks at the center- does exercises.   OA- complains of pain in her back. Does not like tylenol but not sure why. Using advil occasionally.   Hyperlipidemia- pravastatin on medication list but no refill noted on file.   htn- stable on toprolol XL, continues on ASA     Review of Systems:  Review of Systems  Constitutional: Negative for chills, fever and malaise/fatigue.  HENT: Negative for congestion and hearing loss.   Eyes: Negative for blurred vision.  Respiratory: Negative for cough and shortness of breath.   Cardiovascular: Negative for chest pain, palpitations, orthopnea, claudication and leg swelling.  Gastrointestinal: Negative for abdominal pain, blood in stool, constipation, diarrhea and melena.  Genitourinary: Negative for dysuria.   No incontinence  Musculoskeletal: Negative for falls.  Skin:       Bruises very easily  Neurological: Negative for dizziness and loss of consciousness.  Endo/Heme/Allergies: Bruises/bleeds easily.  Psychiatric/Behavioral: Positive for memory loss. Negative for depression. The patient is not nervous/anxious and does not have insomnia.        Insomnia better    Past Medical History:  Diagnosis Date  . Brittle nails   . CAD (coronary artery disease)    mild by cath in 2010  . Cellulitis   . Cellulitis of left anterior lower leg   . Chronic kidney disease, stage II (mild)   . Dementia with behavioral disturbance (HCC)   . Depression with anxiety 05/25/2007  . Dyslipidemia   . GERD (gastroesophageal reflux disease)   . Hallucinations   . Head ache   . Hordeolum externum of right eye   . Hypertension   . Leg ulcer, left, limited to breakdown of skin (HCC)   . Mitral insufficiency   . MVP (mitral valve prolapse)   . Occult blood in stools   . Open wound of lower leg, right, sequela   . Open wound of lower leg, right, subsequent encounter    stage 3 ulcer  . Osteopenia   . Skin lesion of hand   . Urinary frequency   . Urticaria   . Vertigo    Past Surgical History:  Procedure Laterality Date  . APPENDECTOMY    . CARDIAC  CATHETERIZATION  06/16/2008   normal LV systolic function, MVP with some mild MR, prox LAD disease (50%) - Dr. Mervyn Skeeters. [facility]  . CARDIAC CATHETERIZATION  10/16/2005   normal LV systolic function, MVP with trace to 1+ MR, mild nonobstructive CAD involving LAD 30% prox, 10-20 narrowing in prox LAD (Dr. Bishop Limbo)  . CATARACT EXTRACTION, BILATERAL    . CHOLECYSTECTOMY    . NM MYOCAR PERF WALL MOTION  02/2011   lexiscan myoview; perfusion dfect of 5% of LV myocardium, small area of anteroseptal breast attenuation artifact, no reversible ischemia, EF 81%, abnormal but low risk scan  . TONSILLECTOMY     Social History:   reports that she has never smoked. She has never  used smokeless tobacco. She reports that she does not drink alcohol or use drugs.  Family History  Problem Relation Age of Onset  . Heart attack Mother 39  . Pneumonia Father 60  . Alzheimer's disease Father   . Diabetes Sister 11  . Heart Problems Brother 51  . Dementia Sister 67  . Pneumonia Sister        67 weeks old  . Dementia Sister 46  . Alzheimer's disease Sister 31    Medications: Patient's Medications  New Prescriptions   No medications on file  Previous Medications   ASPIRIN 81 MG TABLET    Take 81 mg by mouth daily.   CALCIUM CARBONATE-VITAMIN D (CALTRATE 600+D) 600-400 MG-UNIT TABLET    Take 1 tablet by mouth 2 (two) times daily.   DONEPEZIL (ARICEPT) 10 MG TABLET    Take 1 tablet (10 mg total) by mouth at bedtime.   MEMANTINE (NAMENDA) 10 MG TABLET    Take 1 tablet (10 mg total) by mouth 2 (two) times daily.   METOPROLOL SUCCINATE (TOPROL-XL) 25 MG 24 HR TABLET    Daughter doesn't think she has this   MULTIPLE VITAMINS-MINERALS (CENTRUM SILVER PO)    Take 1 tablet by mouth daily.    PRAVASTATIN (PRAVACHOL) 40 MG TABLET    Take 40 mg by mouth daily.   TRAZODONE (DESYREL) 50 MG TABLET    Take 0.5 tablets (25 mg total) by mouth at bedtime as needed for sleep.  Modified Medications   Modified Medication Previous Medication   ZOSTER VACCINE ADJUVANTED [facility]) INJECTION Zoster Vaccine Adjuvanted [facility]) injection      Inject 0.5 mLs into the muscle once for 1 dose.    Inject 0.5 mLs into the muscle once.  Discontinued Medications   No medications on file     Physical Exam:  Vitals:   12/14/17 1452  BP: 128/72  Pulse: (!) 54  Temp: 97.8 F (36.6 C)  TempSrc: Oral  Weight: 125 lb (56.7 kg)  Height: 5\' 5"  (1.651 m)   Body mass index is 20.8 kg/m.  Physical Exam  Constitutional: She is oriented to person, place, and time. No distress.  Thin female  HENT:  Head: Normocephalic and atraumatic.  Eyes: Pupils are equal, round, and reactive to light.    Neck: Normal range of motion.  Cardiovascular: Normal rate, regular rhythm, normal heart sounds and intact distal pulses.  Pulmonary/Chest: Effort normal and breath sounds normal. No respiratory distress.  Abdominal: Soft. Bowel sounds are normal.  Musculoskeletal: Normal range of motion.  Walks w/o assistive device  Neurological: She is alert and oriented to person, place, and time.  Skin: Skin is warm and dry.  Psychiatric: She has a normal mood and affect.    Labs reviewed:  Basic Metabolic Panel: Recent Labs    03/03/17 03/05/17 03/30/17 1431  NA 137 139 140  K 3.4 3.4 4.2  CL  --   --  101  CO2  --   --  32  GLUCOSE  --   --  140*  BUN 28* 26* 20  CREATININE 0.5 0.5 0.83  CALCIUM  --   --  9.6  TSH  --   --  6.79*   Liver Function Tests: Recent Labs    03/05/17 03/30/17 1431  AST 54* 22  ALT 39* 21  ALKPHOS 86  --   BILITOT  --  0.3  PROT  --  7.0   No results for input(s): LIPASE, AMYLASE in the last 8760 hours. No results for input(s): AMMONIA in the last 8760 hours. CBC: Recent Labs    02/24/17 03/05/17 03/30/17 1431  WBC 4.6  4.6 5.4 7.0  NEUTROABS  --   --  4,711  HGB 12.6  12.6 12.8 12.7  HCT 38  38 39 37.1  MCV  --   --  89.0  PLT 250 259 256   Lipid Panel: Recent Labs    03/07/17  CHOL 146  HDL 77*  LDLCALC 61  TRIG 42   TSH: Recent Labs    03/30/17 1431  TSH 6.79*   A1C: No results found for: HGBA1C   Assessment/Plan 1. Dementia with behavioral disturbance, unspecified dementia type (HCC) Has been stable, living with daughter doing well. No acute changes to cognitive or functional status. Will continue current medication  - memantine (NAMENDA) 10 MG tablet; Take 1 tablet (10 mg total) by mouth 2 (two) times daily.  Dispense: 180 tablet; Refill: 1 - donepezil (ARICEPT) 10 MG tablet; Take 1 tablet (10 mg total) by mouth at bedtime.  Dispense: 90 tablet; Refill: 1  2. Insomnia, unspecified type Improved sleeping, using trazodone  PRN only at this time.  - traZODone (DESYREL) 50 MG tablet; Take 0.5 tablets (25 mg total) by mouth at bedtime as needed for sleep.  Dispense: 90 tablet; Refill: 0  3. Need for influenza vaccination - Flu vaccine HIGH DOSE PF (Fluzone High dose)  4. Dyslipidemia LDL at goal in feb, continues on pravastatin  - COMPLETE METABOLIC PANEL WITH GFR  5. Essential hypertension Stable, will continue current regimen  - CBC with Differential/Platelets  6. Elevated TSH Will follow up TSH - TSH  7. Osteopenia -Recommended to continue caltrate with D 600/400 twice a day with weight bearing activities.   Next appt:  Janene HarveyJessica K. Biagio BorgEubanks, AGNP  [facility] [phone]

## 2018-01-14 ENCOUNTER — Telehealth: Payer: Self-pay

## 2018-01-14 NOTE — Telephone Encounter (Signed)
Patient's daughter called requesting a clarification on metoprolol rx. [name] picked up rx today and it stated 1 by mouth daily yet patient has always taken 1/2 by mouth daily   After reviewing chart I noted that Sharon SellerEubanks, Jessica K, NP sent rx to pharmacy on 12/14/17 (last OV) with instructions 1 by mouth daily and a notation that daughter does not think she has this.  Patient seen Dr.Reed 07/27/17 and instructions for metoprolol was blank other than the same notation "Daughter does not think she has this"  I was unable to clarify sig for medication. [name] aware I will send a message to Sharon SellerEubanks, Jessica K, NP who is not in office today. Expected response Friday 01/15/18, [name] agreed with action.  Please advise

## 2018-01-15 NOTE — Telephone Encounter (Signed)
If she is always taking 1/2 tablet lets continue these instructions and update epic accordingly

## 2018-01-15 NOTE — Telephone Encounter (Signed)
[facility] informing her of Jessica's response. Medication list updated

## 2018-01-25 ENCOUNTER — Other Ambulatory Visit: Payer: Self-pay | Admitting: *Deleted

## 2018-01-25 DIAGNOSIS — F0391 Unspecified dementia with behavioral disturbance: Secondary | ICD-10-CM

## 2018-01-25 MED ORDER — MEMANTINE HCL 10 MG PO TABS: 10.0000 mg | ORAL_TABLET | Freq: Two times a day (BID) | ORAL | 3 refills | Status: DC

## 2018-01-25 MED ORDER — PRAVASTATIN SODIUM 40 MG PO TABS: 40.0000 mg | ORAL_TABLET | Freq: Every day | ORAL | 3 refills | Status: DC

## 2018-01-25 MED ORDER — METOPROLOL SUCCINATE ER 25 MG PO TB24: ORAL_TABLET | ORAL | 3 refills | Status: DC

## 2018-01-25 MED ORDER — DONEPEZIL HCL 10 MG PO TABS: 10.0000 mg | ORAL_TABLET | Freq: Every day | ORAL | 3 refills | Status: DC

## 2018-01-25 NOTE — Telephone Encounter (Signed)
Patient requested Mail order Rx's to be sent to [facility]. Requested #[phone] with [facility]. Current with Appointment and has upcoming.   [facility] due to HIGH ALERT Warning.

## 2018-02-02 ENCOUNTER — Other Ambulatory Visit: Payer: Self-pay | Admitting: *Deleted

## 2018-02-02 NOTE — Telephone Encounter (Signed)
[name], caregiver called and stated that patient's pharmacy has changed this year to [facility]. Added to patient's chart.

## 2018-02-04 ENCOUNTER — Other Ambulatory Visit: Payer: Self-pay | Admitting: *Deleted

## 2018-02-04 DIAGNOSIS — F0391 Unspecified dementia with behavioral disturbance: Secondary | ICD-10-CM

## 2018-02-04 MED ORDER — DONEPEZIL HCL 10 MG PO TABS: 10.0000 mg | ORAL_TABLET | Freq: Every day | ORAL | 1 refills | Status: DC

## 2018-02-04 MED ORDER — MEMANTINE HCL 10 MG PO TABS: 10.0000 mg | ORAL_TABLET | Freq: Two times a day (BID) | ORAL | 1 refills | Status: DC

## 2018-02-04 MED ORDER — PRAVASTATIN SODIUM 40 MG PO TABS: 40.0000 mg | ORAL_TABLET | Freq: Every day | ORAL | 1 refills | Status: DC

## 2018-02-04 MED ORDER — METOPROLOL SUCCINATE ER 25 MG PO TB24: ORAL_TABLET | ORAL | 1 refills | Status: DC

## 2018-02-04 NOTE — Telephone Encounter (Signed)
Patient requested refills. New year has a new mail order pharmacy. Needs Rx's faxed to new pharmacy.   Pended and sent to [facility] for approval due to HIGH ALERT Warning.

## 2018-02-08 ENCOUNTER — Encounter: Payer: Self-pay | Admitting: Nurse Practitioner

## 2018-04-30 ENCOUNTER — Encounter: Payer: PPO | Admitting: Nurse Practitioner

## 2018-04-30 ENCOUNTER — Ambulatory Visit: Payer: PPO

## 2018-04-30 ENCOUNTER — Ambulatory Visit: Payer: PPO | Admitting: Nurse Practitioner

## 2018-05-12 ENCOUNTER — Encounter: Payer: Self-pay | Admitting: Nurse Practitioner

## 2018-05-12 ENCOUNTER — Ambulatory Visit (INDEPENDENT_AMBULATORY_CARE_PROVIDER_SITE_OTHER): Payer: Medicare Other | Admitting: Nurse Practitioner

## 2018-05-12 ENCOUNTER — Other Ambulatory Visit: Payer: Self-pay

## 2018-05-12 ENCOUNTER — Ambulatory Visit: Payer: PPO | Admitting: Nurse Practitioner

## 2018-05-12 DIAGNOSIS — I2510 Atherosclerotic heart disease of native coronary artery without angina pectoris: Secondary | ICD-10-CM

## 2018-05-12 DIAGNOSIS — G4700 Insomnia, unspecified: Secondary | ICD-10-CM

## 2018-05-12 DIAGNOSIS — Z0000 Encounter for general adult medical examination without abnormal findings: Secondary | ICD-10-CM

## 2018-05-12 DIAGNOSIS — M8580 Other specified disorders of bone density and structure, unspecified site: Secondary | ICD-10-CM | POA: Diagnosis not present

## 2018-05-12 DIAGNOSIS — I10 Essential (primary) hypertension: Secondary | ICD-10-CM

## 2018-05-12 DIAGNOSIS — I2583 Coronary atherosclerosis due to lipid rich plaque: Secondary | ICD-10-CM

## 2018-05-12 DIAGNOSIS — F0391 Unspecified dementia with behavioral disturbance: Secondary | ICD-10-CM | POA: Diagnosis not present

## 2018-05-12 DIAGNOSIS — E785 Hyperlipidemia, unspecified: Secondary | ICD-10-CM

## 2018-05-12 NOTE — Progress Notes (Signed)
Subjective:   Cleon GustinLillian C Crabtree is a 85 y.o. female who presents for Medicare Annual (Subsequent) preventive examination.  Review of Systems:         Objective:     Vitals: There were no vitals taken for this visit.  There is no height or weight on file to calculate BMI.  Advanced Directives 05/12/2018 07/27/2017 04/27/2017 04/27/2017  Does Patient Have a Medical Advance Directive? Yes Yes Yes Yes  Type of Estate agentAdvance Directive Healthcare Power of Cotton[place]Attorney;Living will;Out of facility DNR (pink MOST or yellow form) Out of facility DNR (pink MOST or yellow form) Out of facility DNR (pink MOST or yellow form) Healthcare Power of Ken CarylAttorney;Living will;Out of facility DNR (pink MOST or yellow form)  Does patient want to make changes to medical advance directive? - No - Patient declined - No - Patient declined  Copy of Healthcare Power of Attorney in Chart? Yes - validated most recent copy scanned in chart (See row information) - - No - copy requested  Pre-existing out of facility DNR order (yellow form or pink MOST form) Yellow form placed in chart (order not valid for inpatient use) Yellow form placed in chart (order not valid for inpatient use) Yellow form placed in chart (order not valid for inpatient use) Yellow form placed in chart (order not valid for inpatient use)    Tobacco Social History   Tobacco Use  Smoking Status Never Smoker  Smokeless Tobacco Never Used     Counseling given: Not Answered   Clinical Intake:  Pre-visit preparation completed: Yes  Pain : No/denies pain     BMI - recorded: 20.8 Nutritional Status: BMI of 19-24  Normal Nutritional Risks: Unintentional weight loss, Non-healing wound Diabetes: No  How often do you need to have someone help you when you read instructions, pamphlets, or other written materials from your doctor or pharmacy?: 5 - Always What is the last grade level you completed in school?: 12 grade  Interpreter Needed?: No     Past Medical  History:  Diagnosis Date  . Brittle nails   . CAD (coronary artery disease)    mild by cath in 2010  . Cellulitis   . Cellulitis of left anterior lower leg   . Chronic kidney disease, stage II (mild)   . Dementia with behavioral disturbance (HCC)   . Depression with anxiety 05/25/2007  . Dyslipidemia   . GERD (gastroesophageal reflux disease)   . Hallucinations   . Head ache   . Hordeolum externum of right eye   . Hypertension   . Leg ulcer, left, limited to breakdown of skin (HCC)   . Mitral insufficiency   . MVP (mitral valve prolapse)   . Occult blood in stools   . Open wound of lower leg, right, sequela   . Open wound of lower leg, right, subsequent encounter    stage 3 ulcer  . Osteopenia   . Skin lesion of hand   . Urinary frequency   . Urticaria   . Vertigo    Past Surgical History:  Procedure Laterality Date  . APPENDECTOMY    . CARDIAC CATHETERIZATION  06/16/2008   normal LV systolic function, MVP with some mild MR, prox LAD disease (50%) - Dr. Mervyn SkeetersA. [facility]  . CARDIAC CATHETERIZATION  10/16/2005   normal LV systolic function, MVP with trace to 1+ MR, mild nonobstructive CAD involving LAD 30% prox, 10-20 narrowing in prox LAD (Dr. Bishop Limbo. [place])  . CATARACT EXTRACTION, BILATERAL    .  CHOLECYSTECTOMY    . NM MYOCAR PERF WALL MOTION  02/2011   lexiscan myoview; perfusion dfect of 5% of LV myocardium, small area of anteroseptal breast attenuation artifact, no reversible ischemia, EF 81%, abnormal but low risk scan  . TONSILLECTOMY     Family History  Problem Relation Age of Onset  . Heart attack Mother 73  . Pneumonia Father 16  . Alzheimer's disease Father   . Diabetes Sister 40  . Heart Problems Brother 44  . Dementia Sister 99  . Pneumonia Sister        40 weeks old  . Dementia Sister 31  . Alzheimer's disease Sister 37   Social History   Socioeconomic History  . Marital status: Widowed    Spouse name: Not on file  . Number of children: 1  . Years of  education: 43  . Highest education level: Not on file  Occupational History    Employer: RETIRED  Social Needs  . Financial resource strain: Not hard at all  . Food insecurity:    Worry: Never true    Inability: Never true  . Transportation needs:    Medical: No    Non-medical: No  Tobacco Use  . Smoking status: Never Smoker  . Smokeless tobacco: Never Used  Substance and Sexual Activity  . Alcohol use: No  . Drug use: No  . Sexual activity: Never  Lifestyle  . Physical activity:    Days per week: 2 days    Minutes per session: 30 min  . Stress: Only a little  Relationships  . Social connections:    Talks on phone: More than three times a week    Gets together: More than three times a week    Attends religious service: More than 4 times per year    Active member of club or organization: No    Attends meetings of clubs or organizations: Never    Relationship status: Widowed  Other Topics Concern  . Not on file  Social History Narrative   Social History      Diet?       Do you drink/eat things with caffeine?  Coffee, once daily      Marital status?       widowed                             What year were you married? 1949   Children- 1   Do you live in a house, apartment, assisted living, condo, trailer, etc.?    05/05/17 living with dgtr, Ephraim Hamburger      Is it one or more stories? one      How many persons live in your home? 2      Do you have any pets in your home? (please list) yes, Izela's dog-Susie and daughter's dog Molly      Highest level of education completed? 12      Current or past profession: [employer]      Do you exercise?              yes                        Type & how often? Chair exercises at [facility] in [place]      Advanced Directives      Do you have a living will? yes      Do  you have a DNR form?                                  If not, do you want to discuss one?      Do you have signed POA/HPOA for forms?   yes      Functional Status      Do you have difficulty bathing or dressing yourself? no      Do you have difficulty preparing food or eating? No- daughter prepares food now      Do you have difficulty managing your medications? yes      Do you have difficulty managing your finances? yes      Do you have difficulty affording your medications? No-insurance    Outpatient Encounter Medications as of 05/12/2018  Medication Sig  . acetaminophen (TYLENOL) 325 MG tablet Take 325 mg by mouth as needed (back pain).  Marland Kitchen amoxicillin (AMOXIL) 500 MG capsule Take 4 capsules by mouth 1 hour prior to dental appointment every 4 months  . aspirin 81 MG tablet Take 81 mg by mouth daily.  . Calcium Carbonate-Vitamin D (CALTRATE 600+D) 600-400 MG-UNIT tablet Take 1 tablet by mouth 2 (two) times daily.  Marland Kitchen donepezil (ARICEPT) 10 MG tablet Take 1 tablet (10 mg total) by mouth at bedtime.  . memantine (NAMENDA) 10 MG tablet Take 1 tablet (10 mg total) by mouth 2 (two) times daily.  . metoprolol succinate (TOPROL-XL) 25 MG 24 hr tablet 1/2 tablet by mouth daily  . Multiple Vitamins-Minerals (CENTRUM SILVER PO) Take 1 tablet by mouth daily.   . pravastatin (PRAVACHOL) 40 MG tablet Take 1 tablet (40 mg total) by mouth daily.  . traZODone (DESYREL) 50 MG tablet Take 0.5 tablets (25 mg total) by mouth at bedtime as needed for sleep.   No facility-administered encounter medications on file as of 05/12/2018.     Activities of Daily Living No flowsheet data found.  Patient Care Team: Sharon Seller, NP as PCP - General (Geriatric Medicine)    Assessment:   This is a routine wellness examination for Mayelin.  Exercise Activities and Dietary recommendations    Goals   None     Fall Risk Fall Risk  05/12/2018 05/12/2018 12/14/2017 07/27/2017 05/05/2017  Falls in the past year? 0 0 0 No Yes  Number falls in past yr: 0 0 0 - 2 or more  Injury with Fall? 0 0 0 - Yes  Comment - - - - skin tears, leg injury   Risk for fall due to : - - - - Other (Comment)  Risk for fall due to: Comment - - - - tripped over dog's leash   Is the patient's home free of loose throw rugs in walkways, pet beds, electrical cords, etc?   yes      Grab bars in the bathroom? yes      Handrails on the stairs?  No stairs      Adequate lighting?   yes  Timed Get Up and Go performed: n/a  Depression Screen PHQ 2/9 Scores 05/12/2018 05/12/2018 07/27/2017 04/27/2017  PHQ - 2 Score 0 0 0 0     Cognitive Function MMSE - Mini Mental State Exam 05/05/2017 03/30/2017  Orientation to time 0 3  Orientation to Place 4 3  Registration 2 0  Attention/ Calculation 1 0  Recall 0 2  Language- name 2 objects 2 2  Language-  repeat 0 0  Language- follow 3 step command 3 3  Language- read & follow direction 1 1  Write a sentence 0 1  Copy design 1 1  Total score 14 16     6CIT Screen 05/12/2018  What Year? 0 points  What month? 3 points  What time? 0 points  Count back from 20 0 points  Months in reverse 2 points  Repeat phrase 10 points  Total Score 15    Immunization History  Administered Date(s) Administered  . Influenza, High Dose Seasonal PF 12/14/2017  . Influenza-Unspecified 12/20/2012, 02/16/2015  . Pneumococcal Conjugate-13 02/01/2014  . Pneumococcal Polysaccharide-23 01/20/1994  . Tdap 05/07/2011  . Zoster Recombinat (Shingrix) 09/19/2016    Qualifies for Shingles Vaccine?yes  Screening Tests Health Maintenance  Topic Date Due  . INFLUENZA VACCINE  08/21/2018  . TETANUS/TDAP  05/06/2021  . DEXA SCAN  Completed  . PNA vac Low Risk Adult  Completed    Cancer Screenings: Lung: Low Dose CT Chest recommended if Age 82-80 years, 30 pack-year currently smoking OR have quit w/in 15years. Patient does not qualify. Breast:  Up to date on Mammogram? Aged out Up to date of Bone Density/Dexa? Yes Colorectal: aged out  Additional Screenings: : Hepatitis C Screening: n/a     Plan:      I have personally  reviewed and noted the following in the patient's chart:   . Medical and social history . Use of alcohol, tobacco or illicit drugs  . Current medications and supplements . Functional ability and status . Nutritional status . Physical activity . Advanced directives . List of other physicians . Hospitalizations, surgeries, and ER visits in previous 12 months . Vitals . Screenings to include cognitive, depression, and falls . Referrals and appointments  In addition, I have reviewed and discussed with patient certain preventive protocols, quality metrics, and best practice recommendations. A written personalized care plan for preventive services as well as general preventive health recommendations were provided to patient.     Sharon Seller, NP  05/12/2018

## 2018-05-12 NOTE — Progress Notes (Signed)
This service is provided via telemedicine  No vital signs collected/recorded due to the encounter was a telemedicine visit.   Location of patient (ex: home, work): Home  Patient consents to a telephone visit:  Yes  Location of the provider (ex: office, home):  [facility], Office   Name of any referring provider: N/A  Names of all persons participating in the telemedicine service and their role in the encounter:  S.Chrae B/CMA, [name] (daughter) Abbey Chatters, NP, and Patient   Time spent on call:  10 min with medical assistant   Virtual Visit via Telephone Note  I connected with Cleon Gustin on 05/12/18 at  3:15 PM EDT by telephone and verified that I am speaking with the correct person using two identifiers.   I discussed the limitations, risks, security and privacy concerns of performing an evaluation and management service by telephone and the availability of in person appointments. I also discussed with the patient that there may be a patient responsible charge related to this service. The patient expressed understanding and agreed to proceed.    Careteam: Patient Care Team: Sharon Seller, NP as PCP - General (Geriatric Medicine)  Advanced Directive information Does Patient Have a Medical Advance Directive?: Yes, Type of Advance Directive: Healthcare Power of [place];Living will;Out of facility DNR (pink MOST or yellow form), Pre-existing out of facility DNR order (yellow form or pink MOST form): Yellow form placed in chart (order not valid for inpatient use)  Allergies  Allergen Reactions  . Corn-Containing Products   . Other Hives    Grapes and banana concentrate  . Prilosec [Omeprazole]   . Phenobarbital Rash    rash  . Sulfa Antibiotics Rash    Chief Complaint  Patient presents with  . Medical Management of Chronic Issues    5-6 month follow-up. Tele-visit      HPI: Patient is a 85 y.o. female for routine follow up via televisit   Dementia- continues on Aricept and namenda for memory. No significant decline in cognitive or functional status. No longer having behaviors at this time.   htn- continues on metoprolol succinate, does not check blood pressure at home. At the center she would check but unsure what the readings were Would exercise at the center that she was going to -currently not going to the center due to COVID-19.   Osteopenia- continues on cal and vit d, Dexa due June 2020  Hyperlipidemia- continues on pravastatin   Insomnia- has trazodone but does not need it, sleeping well.   Review of Systems:  Review of Systems  Constitutional: Negative for chills, fever, malaise/fatigue and weight loss.  HENT: Negative for congestion and hearing loss.   Eyes: Negative for blurred vision.  Respiratory: Negative for cough and shortness of breath.   Cardiovascular: Negative for chest pain, palpitations, orthopnea, claudication and leg swelling.  Gastrointestinal: Negative for abdominal pain, blood in stool, constipation, diarrhea and melena.  Genitourinary: Negative for dysuria.       No incontinence  Musculoskeletal: Negative for falls, joint pain and myalgias.  Skin: Negative for itching and rash.       Bruises very easily  Neurological: Negative for dizziness, loss of consciousness and headaches.  Endo/Heme/Allergies: Bruises/bleeds easily.  Psychiatric/Behavioral: Positive for memory loss. Negative for depression. The patient is not nervous/anxious and does not have insomnia.        Insomnia better    Past Medical History:  Diagnosis Date  . Brittle nails   .  CAD (coronary artery disease)    mild by cath in 2010  . Cellulitis   . Cellulitis of left anterior lower leg   . Chronic kidney disease, stage II (mild)   . Dementia with behavioral disturbance (HCC)   . Depression with anxiety 05/25/2007  . Dyslipidemia   . GERD (gastroesophageal reflux disease)   . Hallucinations   . Head ache   .  Hordeolum externum of right eye   . Hypertension   . Leg ulcer, left, limited to breakdown of skin (HCC)   . Mitral insufficiency   . MVP (mitral valve prolapse)   . Occult blood in stools   . Open wound of lower leg, right, sequela   . Open wound of lower leg, right, subsequent encounter    stage 3 ulcer  . Osteopenia   . Skin lesion of hand   . Urinary frequency   . Urticaria   . Vertigo    Past Surgical History:  Procedure Laterality Date  . APPENDECTOMY    . CARDIAC CATHETERIZATION  06/16/2008   normal LV systolic function, MVP with some mild MR, prox LAD disease (50%) - Dr. Mervyn SkeetersA. Little  . CARDIAC CATHETERIZATION  10/16/2005   normal LV systolic function, MVP with trace to 1+ MR, mild nonobstructive CAD involving LAD 30% prox, 10-20 narrowing in prox LAD (Dr. Bishop Limbo. Kelly)  . CATARACT EXTRACTION, BILATERAL    . CHOLECYSTECTOMY    . NM MYOCAR PERF WALL MOTION  02/2011   lexiscan myoview; perfusion dfect of 5% of LV myocardium, small area of anteroseptal breast attenuation artifact, no reversible ischemia, EF 81%, abnormal but low risk scan  . TONSILLECTOMY     Social History:   reports that she has never smoked. She has never used smokeless tobacco. She reports that she does not drink alcohol or use drugs.  Family History  Problem Relation Age of Onset  . Heart attack Mother [id number]  . Pneumonia Father 3688  . Alzheimer's disease Father   . Diabetes Sister 1571  . Heart Problems Brother 7666  . Dementia Sister 4482  . Pneumonia Sister        83 weeks old  . Dementia Sister 5791  . Alzheimer's disease Sister 1285    Medications: Patient's Medications  New Prescriptions   No medications on file  Previous Medications   ACETAMINOPHEN (TYLENOL) 325 MG TABLET    Take 325 mg by mouth as needed (back pain).   AMOXICILLIN (AMOXIL) 500 MG CAPSULE    Take 4 capsules by mouth 1 hour prior to dental appointment every 4 months   ASPIRIN 81 MG TABLET    Take 81 mg by mouth daily.   CALCIUM  CARBONATE-VITAMIN D (CALTRATE 600+D) 600-400 MG-UNIT TABLET    Take 1 tablet by mouth 2 (two) times daily.   DONEPEZIL (ARICEPT) 10 MG TABLET    Take 1 tablet (10 mg total) by mouth at bedtime.   MEMANTINE (NAMENDA) 10 MG TABLET    Take 1 tablet (10 mg total) by mouth 2 (two) times daily.   METOPROLOL SUCCINATE (TOPROL-XL) 25 MG 24 HR TABLET    1/2 tablet by mouth daily   MULTIPLE VITAMINS-MINERALS (CENTRUM SILVER PO)    Take 1 tablet by mouth daily.    PRAVASTATIN (PRAVACHOL) 40 MG TABLET    Take 1 tablet (40 mg total) by mouth daily.   TRAZODONE (DESYREL) 50 MG TABLET    Take 0.5 tablets (25 mg total) by mouth at bedtime as  needed for sleep.  Modified Medications   No medications on file  Discontinued Medications   No medications on file     Physical Exam: unable due to televisit.    Labs reviewed: Basic Metabolic Panel: Recent Labs    12/14/17 1536  NA 138  K 4.0  CL 99  CO2 32  GLUCOSE 90  BUN 15  CREATININE 0.84  CALCIUM 9.4  TSH 5.05*   Liver Function Tests: Recent Labs    12/14/17 1536  AST 21  ALT 15  BILITOT 0.3  PROT 7.1   No results for input(s): LIPASE, AMYLASE in the last 8760 hours. No results for input(s): AMMONIA in the last 8760 hours. CBC: Recent Labs    12/14/17 1536  WBC 5.7  NEUTROABS 3,124  HGB 13.6  HCT 40.2  MCV 91.4  PLT 209   Lipid Panel: No results for input(s): CHOL, HDL, LDLCALC, TRIG, CHOLHDL, LDLDIRECT in the last 8760 hours. TSH: Recent Labs    12/14/17 1536  TSH 5.05*   A1C: No results found for: HGBA1C   Assessment/Plan 1. Osteopenia, unspecified location Continues on cal and vit D, will be due for Dexa scan in June 2020 - COMPLETE METABOLIC PANEL WITH GFR; Future  2. Insomnia, unspecified type Improved at this time. Not needing trazodone at this time.   3. Dementia with behavioral disturbance, unspecified dementia type (HCC) Stable, behaviors have improved, no significant changes in cognitive or functional  status, will continue on aricept and namenda.   4. Coronary artery disease due to lipid rich plaque Stable, without chest pains, continues on asa - CBC with Differential/Platelet; Future  5. Essential hypertension Blood pressure had previously been stable. Will continue current regimen with dietary modifications - COMPLETE METABOLIC PANEL WITH GFR; Future - CBC with Differential/Platelet; Future  6. Dyslipidemia -continues on pravachol 40 mg daily  - COMPLETE METABOLIC PANEL WITH GFR; Future - Lipid panel; Future  Next appt: 4 month follow up. Janene Harvey. Biagio Borg  [facility] [phone]   Follow Up Instructions:    I discussed the assessment and treatment plan with the patient. The patient was provided an opportunity to ask questions and all were answered. The patient agreed with the plan and demonstrated an understanding of the instructions.   The patient was advised to call back or seek an in-person evaluation if the symptoms worsen or if the condition fails to improve as anticipated.  I provided 13 minutes of non-face-to-face time during this encounter.   Sharon Seller, NP

## 2018-05-12 NOTE — Patient Instructions (Addendum)
Julia Henson , Thank you for taking time to come for your Medicare Wellness Visit. I appreciate your ongoing commitment to your health goals. Please review the following plan we discussed and let me know if I can assist you in the future.   Screening recommendations/referrals: Colonoscopy-aged out Mammogram -aged out Bone Density-will be due in July 2020 Recommended yearly ophthalmology/optometry visit for glaucoma screening and checkup Recommended yearly dental visit for hygiene and checkup  Vaccinations: Influenza vaccine- due September 2020 Pneumococcal vaccine -up to date Tdap vaccine -up to date Shingles vaccine up to date    Advanced directives: on file   Conditions/risks identified: decrease mobility/falls related to memory loss.   Next appointment: 1 year for AWV   Preventive Care 40 Years and Older, Female Preventive care refers to lifestyle choices and visits with your health care provider that can promote health and wellness. What does preventive care include?  A yearly physical exam. This is also called an annual well check.  Dental exams once or twice a year.  Routine eye exams. Ask your health care provider how often you should have your eyes checked.  Personal lifestyle choices, including:  Daily care of your teeth and gums.  Regular physical activity.  Eating a healthy diet.  Avoiding tobacco and drug use.  Limiting alcohol use.  Practicing safe sex.  Taking low-dose aspirin every day.  Taking vitamin and mineral supplements as recommended by your health care provider. What happens during an annual well check? The services and screenings done by your health care provider during your annual well check will depend on your age, overall health, lifestyle risk factors, and family history of disease. Counseling  Your health care provider may ask you questions about your:  Alcohol use.  Tobacco use.  Drug use.  Emotional well-being.  Home and  relationship well-being.  Sexual activity.  Eating habits.  History of falls.  Memory and ability to understand (cognition).  Work and work Astronomer.  Reproductive health. Screening  You may have the following tests or measurements:  Height, weight, and BMI.  Blood pressure.  Lipid and cholesterol levels. These may be checked every 5 years, or more frequently if you are over 67 years old.  Skin check.  Lung cancer screening. You may have this screening every year starting at age 11 if you have a 30-pack-year history of smoking and currently smoke or have quit within the past 15 years.  Fecal occult blood test (FOBT) of the stool. You may have this test every year starting at age 9.  Flexible sigmoidoscopy or colonoscopy. You may have a sigmoidoscopy every 5 years or a colonoscopy every 10 years starting at age 44.  Hepatitis C blood test.  Hepatitis B blood test.  Sexually transmitted disease (STD) testing.  Diabetes screening. This is done by checking your blood sugar (glucose) after you have not eaten for a while (fasting). You may have this done every 1-3 years.  Bone density scan. This is done to screen for osteoporosis. You may have this done starting at age 15.  Mammogram. This may be done every 1-2 years. Talk to your health care provider about how often you should have regular mammograms. Talk with your health care provider about your test results, treatment options, and if necessary, the need for more tests. Vaccines  Your health care provider may recommend certain vaccines, such as:  Influenza vaccine. This is recommended every year.  Tetanus, diphtheria, and acellular pertussis (Tdap, Td) vaccine. You may  need a Td booster every 10 years.  Zoster vaccine. You may need this after age 93.  Pneumococcal 13-valent conjugate (PCV13) vaccine. One dose is recommended after age 18.  Pneumococcal polysaccharide (PPSV23) vaccine. One dose is recommended after  age 59. Talk to your health care provider about which screenings and vaccines you need and how often you need them. This information is not intended to replace advice given to you by your health care provider. Make sure you discuss any questions you have with your health care provider. Document Released: 02/02/2015 Document Revised: 09/26/2015 Document Reviewed: 11/07/2014 Elsevier Interactive Patient Education  2017 [place] Prevention in the Home Falls can cause injuries. They can happen to people of all ages. There are many things you can do to make your home safe and to help prevent falls. What can I do on the outside of my home?  Regularly fix the edges of walkways and driveways and fix any cracks.  Remove anything that might make you trip as you walk through a door, such as a raised step or threshold.  Trim any bushes or trees on the path to your home.  Use bright outdoor lighting.  Clear any walking paths of anything that might make someone trip, such as rocks or tools.  Regularly check to see if handrails are loose or broken. Make sure that both sides of any steps have handrails.  Any raised decks and porches should have guardrails on the edges.  Have any leaves, snow, or ice cleared regularly.  Use sand or salt on walking paths during winter.  Clean up any spills in your garage right away. This includes oil or grease spills. What can I do in the bathroom?  Use night lights.  Install grab bars by the toilet and in the tub and shower. Do not use towel bars as grab bars.  Use non-skid mats or decals in the tub or shower.  If you need to sit down in the shower, use a plastic, non-slip stool.  Keep the floor dry. Clean up any water that spills on the floor as soon as it happens.  Remove soap buildup in the tub or shower regularly.  Attach bath mats securely with double-sided non-slip rug tape.  Do not have throw rugs and other things on the floor that can  make you trip. What can I do in the bedroom?  Use night lights.  Make sure that you have a light by your bed that is easy to reach.  Do not use any sheets or blankets that are too big for your bed. They should not hang down onto the floor.  Have a firm chair that has side arms. You can use this for support while you get dressed.  Do not have throw rugs and other things on the floor that can make you trip. What can I do in the kitchen?  Clean up any spills right away.  Avoid walking on wet floors.  Keep items that you use a lot in easy-to-reach places.  If you need to reach something above you, use a strong step stool that has a grab bar.  Keep electrical cords out of the way.  Do not use floor polish or wax that makes floors slippery. If you must use wax, use non-skid floor wax.  Do not have throw rugs and other things on the floor that can make you trip. What can I do with my stairs?  Do not leave any items on  the stairs.  Make sure that there are handrails on both sides of the stairs and use them. Fix handrails that are broken or loose. Make sure that handrails are as long as the stairways.  Check any carpeting to make sure that it is firmly attached to the stairs. Fix any carpet that is loose or worn.  Avoid having throw rugs at the top or bottom of the stairs. If you do have throw rugs, attach them to the floor with carpet tape.  Make sure that you have a light switch at the top of the stairs and the bottom of the stairs. If you do not have them, ask someone to add them for you. What else can I do to help prevent falls?  Wear shoes that:  Do not have high heels.  Have rubber bottoms.  Are comfortable and fit you well.  Are closed at the toe. Do not wear sandals.  If you use a stepladder:  Make sure that it is fully opened. Do not climb a closed stepladder.  Make sure that both sides of the stepladder are locked into place.  Ask someone to hold it for you,  if possible.  Clearly mark and make sure that you can see:  Any grab bars or handrails.  First and last steps.  Where the edge of each step is.  Use tools that help you move around (mobility aids) if they are needed. These include:  Canes.  Walkers.  Scooters.  Crutches.  Turn on the lights when you go into a dark area. Replace any light bulbs as soon as they burn out.  Set up your furniture so you have a clear path. Avoid moving your furniture around.  If any of your floors are uneven, fix them.  If there are any pets around you, be aware of where they are.  Review your medicines with your doctor. Some medicines can make you feel dizzy. This can increase your chance of falling. Ask your doctor what other things that you can do to help prevent falls. This information is not intended to replace advice given to you by your health care provider. Make sure you discuss any questions you have with your health care provider. Document Released: 11/02/2008 Document Revised: 06/14/2015 Document Reviewed: 02/10/2014 Elsevier Interactive Patient Education  2017 [employer].

## 2018-05-14 ENCOUNTER — Other Ambulatory Visit: Payer: PPO

## 2018-05-14 ENCOUNTER — Other Ambulatory Visit: Payer: Self-pay

## 2018-05-14 DIAGNOSIS — I2510 Atherosclerotic heart disease of native coronary artery without angina pectoris: Secondary | ICD-10-CM

## 2018-05-14 DIAGNOSIS — M8580 Other specified disorders of bone density and structure, unspecified site: Secondary | ICD-10-CM

## 2018-05-14 DIAGNOSIS — E785 Hyperlipidemia, unspecified: Secondary | ICD-10-CM

## 2018-05-14 DIAGNOSIS — I2583 Coronary atherosclerosis due to lipid rich plaque: Secondary | ICD-10-CM

## 2018-05-14 DIAGNOSIS — I10 Essential (primary) hypertension: Secondary | ICD-10-CM

## 2018-05-15 LAB — CBC WITH DIFFERENTIAL/PLATELET
Absolute Monocytes: 274 cells/uL (ref 200–950)
Basophils Absolute: 29 cells/uL (ref 0–200)
Basophils Relative: 0.6 %
Eosinophils Absolute: 274 cells/uL (ref 15–500)
Eosinophils Relative: 5.7 %
HCT: 42.3 % (ref 35.0–45.0)
Hemoglobin: 14.3 g/dL (ref 11.7–15.5)
Lymphs Abs: 1574 cells/uL (ref 850–3900)
MCH: 30.6 pg (ref 27.0–33.0)
MCHC: 33.8 g/dL (ref 32.0–36.0)
MCV: 90.6 fL (ref 80.0–100.0)
MPV: 9.5 fL (ref 7.5–12.5)
Monocytes Relative: 5.7 %
Neutro Abs: 2650 cells/uL (ref 1500–7800)
Neutrophils Relative %: 55.2 %
Platelets: 238 10*3/uL (ref 140–400)
RBC: 4.67 10*6/uL (ref 3.80–5.10)
RDW: 12 % (ref 11.0–15.0)
Total Lymphocyte: 32.8 %
WBC: 4.8 10*3/uL (ref 3.8–10.8)

## 2018-05-15 LAB — LIPID PANEL
Cholesterol: 162 mg/dL (ref <200)
HDL: 58 mg/dL (ref >=50)
LDL Cholesterol (Calc): 83 mg/dL (calc)
Non-HDL Cholesterol (Calc): 104 mg/dL (calc) (ref <130)
Total CHOL/HDL Ratio: 2.8 (calc) (ref <5.0)
Triglycerides: 114 mg/dL (ref <150)

## 2018-05-15 LAB — COMPLETE METABOLIC PANEL WITH GFR
AG Ratio: 1.2 (calc) (ref 1.0–2.5)
ALT: 13 U/L (ref 6–29)
AST: 20 U/L (ref 10–35)
Albumin: 3.9 g/dL (ref 3.6–5.1)
Alkaline phosphatase (APISO): 63 U/L (ref 37–153)
BUN: 14 mg/dL (ref 7–25)
CO2: 31 mmol/L (ref 20–32)
Calcium: 9.7 mg/dL (ref 8.6–10.4)
Chloride: 101 mmol/L (ref 98–110)
Creat: 0.79 mg/dL (ref 0.60–0.88)
GFR, Est African American: 77 mL/min/{1.73_m2} (ref >=60)
GFR, Est Non African American: 66 mL/min/{1.73_m2} (ref >=60)
Globulin: 3.2 g/dL (calc) (ref 1.9–3.7)
Glucose, Bld: 106 mg/dL — ABNORMAL HIGH (ref 65–99)
Potassium: 4.1 mmol/L (ref 3.5–5.3)
Sodium: 139 mmol/L (ref 135–146)
Total Bilirubin: 0.5 mg/dL (ref 0.2–1.2)
Total Protein: 7.1 g/dL (ref 6.1–8.1)

## 2018-05-16 ENCOUNTER — Encounter: Payer: Self-pay | Admitting: Nurse Practitioner

## 2018-06-14 ENCOUNTER — Other Ambulatory Visit: Payer: Self-pay | Admitting: Nurse Practitioner

## 2018-06-14 DIAGNOSIS — F0391 Unspecified dementia with behavioral disturbance: Secondary | ICD-10-CM

## 2018-08-12 ENCOUNTER — Other Ambulatory Visit: Payer: Self-pay | Admitting: *Deleted

## 2018-08-12 MED ORDER — PRAVASTATIN SODIUM 40 MG PO TABS: 40.0000 mg | ORAL_TABLET | Freq: Every day | ORAL | 1 refills | Status: DC

## 2018-08-12 NOTE — Telephone Encounter (Signed)
Patient daughter requested refill.  Pended and sent to [facility] for approval due to [place] Warning.

## 2018-08-23 ENCOUNTER — Other Ambulatory Visit: Payer: Self-pay | Admitting: *Deleted

## 2018-08-23 DIAGNOSIS — G4700 Insomnia, unspecified: Secondary | ICD-10-CM

## 2018-08-23 DIAGNOSIS — F0391 Unspecified dementia with behavioral disturbance: Secondary | ICD-10-CM

## 2018-08-23 MED ORDER — MEMANTINE HCL 10 MG PO TABS: 10.0000 mg | ORAL_TABLET | Freq: Two times a day (BID) | ORAL | 1 refills | Status: DC

## 2018-08-23 MED ORDER — METOPROLOL SUCCINATE ER 25 MG PO TB24: ORAL_TABLET | ORAL | 1 refills | Status: DC

## 2018-08-23 MED ORDER — DONEPEZIL HCL 10 MG PO TABS: 10.0000 mg | ORAL_TABLET | Freq: Every day | ORAL | 1 refills | Status: DC

## 2018-08-23 MED ORDER — TRAZODONE HCL 50 MG PO TABS: 25.0000 mg | ORAL_TABLET | Freq: Every evening | ORAL | 0 refills | Status: DC | PRN

## 2018-08-23 NOTE — Telephone Encounter (Signed)
Patient caregiver requested rx Pended and sent to [facility] for approval. Due to [place] Warning.

## 2018-09-13 ENCOUNTER — Ambulatory Visit: Payer: Medicare Other | Admitting: Nurse Practitioner

## 2018-09-14 ENCOUNTER — Encounter: Payer: Self-pay | Admitting: Nurse Practitioner

## 2018-09-14 ENCOUNTER — Other Ambulatory Visit: Payer: Self-pay

## 2018-09-14 ENCOUNTER — Ambulatory Visit (INDEPENDENT_AMBULATORY_CARE_PROVIDER_SITE_OTHER): Payer: Medicare Other | Admitting: Nurse Practitioner

## 2018-09-14 DIAGNOSIS — I10 Essential (primary) hypertension: Secondary | ICD-10-CM | POA: Diagnosis not present

## 2018-09-14 DIAGNOSIS — G4700 Insomnia, unspecified: Secondary | ICD-10-CM

## 2018-09-14 DIAGNOSIS — E785 Hyperlipidemia, unspecified: Secondary | ICD-10-CM

## 2018-09-14 DIAGNOSIS — M8580 Other specified disorders of bone density and structure, unspecified site: Secondary | ICD-10-CM | POA: Diagnosis not present

## 2018-09-14 DIAGNOSIS — F0391 Unspecified dementia with behavioral disturbance: Secondary | ICD-10-CM

## 2018-09-14 NOTE — Progress Notes (Signed)
This service is provided via telemedicine  No vital signs collected/recorded due to the encounter was a telemedicine visit.   Location of patient (ex: home, work):  Home  Patient consents to a telephone visit:  Yes  Location of the provider (ex: office, home): [facility], Office   Name of any referring provider: N/A  Names of all persons participating in the telemedicine service and their role in the encounter:  S.Chrae B/CMA, Sherrie Mustache, NP, Pamela(daughter), and Patient   Time spent on call: 9 min with medical assistant      Careteam: Patient Care Team: [name], NP as PCP - General (Geriatric Medicine)  Advanced Directive information Does Patient Have a Medical Advance Directive?: Yes, Type of Advance Directive: Out of facility DNR (pink MOST or yellow form), Pre-existing out of facility DNR order (yellow form or pink MOST form): Yellow form placed in chart (order not valid for inpatient use), Does patient want to make changes to medical advance directive?: No - Patient declined  Allergies  Allergen Reactions  . Corn-Containing Products   . Other Hives    Grapes and banana concentrate  . Prilosec [Omeprazole]   . Phenobarbital Rash    rash  . Sulfa Antibiotics Rash    Chief Complaint  Patient presents with  . Medical Management of Chronic Issues    4 month follow-up. Telephone visit   . Immunizations    Discuss need for flu vaccine      HPI: Patient is a 85 y.o. female for routine follow up via tele-phone visit.   Dementia- center has still not opened due to [place] and having people social distance and wear mask with dementia was a barrier.  Has been staying in but did get to see her sister. Dealing with staying in okay per daughter. Continues on aricept and namenda. Eating well. Still getting food from the center.   Does not do well with the mask. Daughter reports she passed out with a mask. It was the first time she wore one and  it was hot. It looked like she feel asleep, dropped her program and glasses.  EMS came to evaluate her and once she got cool and drank water she was okay. Due to this they have limited where she goes.   Osteopenia- continues on cal and vit d, due for bone density but unable to wear mask.  Hyperlipidemia- continues on pravastatin   Insomnia- sleeps well, all night.  Hypertension- well controlled in the past, continues on toprolol 12.5 mg by mouth daily   No issues with bowel or bladder per daughter.   More sedentary since center is closed, needs more exercise. No falls, gait has been unchanged.   Review of Systems:  Review of Systems  Unable to perform ROS: Dementia    Past Medical History:  Diagnosis Date  . Brittle nails   . CAD (coronary artery disease)    mild by cath in 2010  . Cellulitis   . Cellulitis of left anterior lower leg   . Chronic kidney disease, stage II (mild)   . Dementia with behavioral disturbance ([place])   . Depression with anxiety 05/25/2007  . Dyslipidemia   . GERD (gastroesophageal reflux disease)   . Hallucinations   . Head ache   . Hordeolum externum of right eye   . Hypertension   . Leg ulcer, left, limited to breakdown of skin ([place])   . Mitral insufficiency   . MVP (mitral valve prolapse)   .  Occult blood in stools   . Open wound of lower leg, right, sequela   . Open wound of lower leg, right, subsequent encounter    stage 3 ulcer  . Osteopenia   . Skin lesion of hand   . Urinary frequency   . Urticaria   . Vertigo    Past Surgical History:  Procedure Laterality Date  . APPENDECTOMY    . CARDIAC CATHETERIZATION  06/16/2008   normal LV systolic function, MVP with some mild MR, prox LAD disease (50%) - Dr. Mervyn Skeeters. [facility]  . CARDIAC CATHETERIZATION  10/16/2005   normal LV systolic function, MVP with trace to 1+ MR, mild nonobstructive CAD involving LAD 30% prox, 10-20 narrowing in prox LAD (Dr. Bishop Limbo)  . CATARACT EXTRACTION, BILATERAL    .  CHOLECYSTECTOMY    . NM MYOCAR PERF WALL MOTION  02/2011   lexiscan myoview; perfusion dfect of 5% of LV myocardium, small area of anteroseptal breast attenuation artifact, no reversible ischemia, EF 81%, abnormal but low risk scan  . TONSILLECTOMY     Social History:   reports that she has never smoked. She has never used smokeless tobacco. She reports that she does not drink alcohol or use drugs.  Family History  Problem Relation Age of Onset  . Heart attack Mother 79  . Pneumonia Father 86  . Alzheimer's disease Father   . Diabetes Sister 26  . Heart Problems Brother 80  . Dementia Sister 48  . Pneumonia Sister        41 weeks old  . Dementia Sister 29  . Alzheimer's disease Sister 93    Medications: Patient's Medications  New Prescriptions   No medications on file  Previous Medications   ACETAMINOPHEN (TYLENOL) 325 MG TABLET    Take 325 mg by mouth as needed (back pain).   AMOXICILLIN (AMOXIL) 500 MG CAPSULE    Take 4 capsules by mouth 1 hour prior to dental appointment every 4 months   ASPIRIN 81 MG TABLET    Take 81 mg by mouth daily.   CALCIUM CARBONATE-VITAMIN D (CALTRATE 600+D) 600-400 MG-UNIT TABLET    Take 1 tablet by mouth 2 (two) times daily.   DONEPEZIL (ARICEPT) 10 MG TABLET    Take 1 tablet (10 mg total) by mouth at bedtime.   MEMANTINE (NAMENDA) 10 MG TABLET    Take 1 tablet (10 mg total) by mouth 2 (two) times daily.   METOPROLOL SUCCINATE (TOPROL-XL) 25 MG 24 HR TABLET    1/2 tablet by mouth daily   MULTIPLE VITAMINS-MINERALS (CENTRUM SILVER PO)    Take 1 tablet by mouth daily.    PRAVASTATIN (PRAVACHOL) 40 MG TABLET    Take 1 tablet (40 mg total) by mouth daily.   TRAZODONE (DESYREL) 50 MG TABLET    Take 0.5 tablets (25 mg total) by mouth at bedtime as needed for sleep.  Modified Medications   No medications on file  Discontinued Medications   No medications on file    Physical Exam:  There were no vitals filed for this visit. There is no height or  weight on file to calculate BMI. Wt Readings from Last 3 Encounters:  12/14/17 125 lb (56.7 kg)  07/27/17 120 lb (54.4 kg)  05/05/17 117 lb 8 oz (53.3 kg)    Physical Exam  Labs reviewed: Basic Metabolic Panel: Recent Labs    12/14/17 1536 05/14/18 1042  NA 138 139  K 4.0 4.1  CL 99 101  CO2  32 31  GLUCOSE 90 106*  BUN 15 14  CREATININE 0.84 0.79  CALCIUM 9.4 9.7  TSH 5.05*  --    Liver Function Tests: Recent Labs    12/14/17 1536 05/14/18 1042  AST 21 20  ALT 15 13  BILITOT 0.3 0.5  PROT 7.1 7.1   No results for input(s): LIPASE, AMYLASE in the last 8760 hours. No results for input(s): AMMONIA in the last 8760 hours. CBC: Recent Labs    12/14/17 1536 05/14/18 1042  WBC 5.7 4.8  NEUTROABS 3,124 2,650  HGB 13.6 14.3  HCT 40.2 42.3  MCV 91.4 90.6  PLT 209 238   Lipid Panel: Recent Labs    05/14/18 1042  CHOL 162  HDL 58  LDLCALC 83  TRIG 114  CHOLHDL 2.8   TSH: Recent Labs    12/14/17 1536  TSH 5.05*   A1C: No results found for: HGBA1C   Assessment/Plan 1. Osteopenia, unspecified location -continues on cal and vit d   2. Insomnia, unspecified type -doing much better at this time. Not requiring medication at this time.  3. Dementia with behavioral disturbance, unspecified dementia type (HCC) -stable, continues on namanda and aricept. Encouraged to increase activity as tolerates. No new behaviors at this time. Eating well.   4. Essential hypertension -well controlled in the past, continues on toprol XL 12.5 mg by mouth daily  5. Dyslipidemia -continues on pravastatin 40 mg daily   Next appt: 6 months.  Janene HarveyJessica K. Biagio BorgEubanks, AGNP  [facility] [phone]    Virtual Visit via Telephone Note  I connected with pt on 09/14/18 at 10:30 AM EDT by telephone and verified that I am speaking with the correct person using two identifiers.  Location: Patient: home Provider: office    I discussed the  limitations, risks, security and privacy concerns of performing an evaluation and management service by telephone and the availability of in person appointments. I also discussed with the patient that there may be a patient responsible charge related to this service. The patient expressed understanding and agreed to proceed.   I discussed the assessment and treatment plan with the patient. The patient was provided an opportunity to ask questions and all were answered. The patient agreed with the plan and demonstrated an understanding of the instructions.   The patient was advised to call back or seek an in-person evaluation if the symptoms worsen or if the condition fails to improve as anticipated.  I provided 22 minutes of non-face-to-face time during this encounter.  Janene HarveyJessica K. Biagio BorgEubanks, AGNP Avs printed and mailed

## 2018-10-11 ENCOUNTER — Other Ambulatory Visit: Payer: Self-pay | Admitting: Nurse Practitioner

## 2018-10-11 DIAGNOSIS — G4700 Insomnia, unspecified: Secondary | ICD-10-CM

## 2018-10-31 ENCOUNTER — Other Ambulatory Visit: Payer: Self-pay | Admitting: Nurse Practitioner

## 2018-10-31 DIAGNOSIS — G4700 Insomnia, unspecified: Secondary | ICD-10-CM

## 2018-12-21 ENCOUNTER — Other Ambulatory Visit: Payer: Self-pay | Admitting: Nurse Practitioner

## 2018-12-21 DIAGNOSIS — F0391 Unspecified dementia with behavioral disturbance: Secondary | ICD-10-CM

## 2018-12-22 NOTE — Telephone Encounter (Signed)
Pending medication refills, contraindication and high allergy alert , routing to provider for approval.

## 2019-03-15 ENCOUNTER — Ambulatory Visit: Payer: Medicare Other | Admitting: Nurse Practitioner

## 2019-03-16 ENCOUNTER — Encounter: Payer: Self-pay | Admitting: Nurse Practitioner

## 2019-03-16 ENCOUNTER — Ambulatory Visit (INDEPENDENT_AMBULATORY_CARE_PROVIDER_SITE_OTHER): Payer: Medicare Other | Admitting: Nurse Practitioner

## 2019-03-16 ENCOUNTER — Other Ambulatory Visit: Payer: Self-pay

## 2019-03-16 VITALS — BP 122/78 | HR 50 | Temp 96.6°F | Ht 65.0 in | Wt 124.6 lb

## 2019-03-16 DIAGNOSIS — M8580 Other specified disorders of bone density and structure, unspecified site: Secondary | ICD-10-CM

## 2019-03-16 DIAGNOSIS — M545 Low back pain, unspecified: Secondary | ICD-10-CM

## 2019-03-16 DIAGNOSIS — G4700 Insomnia, unspecified: Secondary | ICD-10-CM

## 2019-03-16 DIAGNOSIS — I2510 Atherosclerotic heart disease of native coronary artery without angina pectoris: Secondary | ICD-10-CM

## 2019-03-16 DIAGNOSIS — G8929 Other chronic pain: Secondary | ICD-10-CM

## 2019-03-16 DIAGNOSIS — I10 Essential (primary) hypertension: Secondary | ICD-10-CM | POA: Diagnosis not present

## 2019-03-16 DIAGNOSIS — F0391 Unspecified dementia with behavioral disturbance: Secondary | ICD-10-CM

## 2019-03-16 DIAGNOSIS — E785 Hyperlipidemia, unspecified: Secondary | ICD-10-CM

## 2019-03-16 DIAGNOSIS — D692 Other nonthrombocytopenic purpura: Secondary | ICD-10-CM | POA: Diagnosis not present

## 2019-03-16 DIAGNOSIS — I2583 Coronary atherosclerosis due to lipid rich plaque: Secondary | ICD-10-CM

## 2019-03-16 LAB — LIPID PANEL
Cholesterol: 169 mg/dL (ref <200)
HDL: 49 mg/dL — ABNORMAL LOW (ref >=50)
LDL Cholesterol (Calc): 85 mg/dL (calc)
Non-HDL Cholesterol (Calc): 120 mg/dL (calc) (ref <130)
Total CHOL/HDL Ratio: 3.4 (calc) (ref <5.0)
Triglycerides: 264 mg/dL — ABNORMAL HIGH (ref <150)

## 2019-03-16 LAB — COMPLETE METABOLIC PANEL WITH GFR
AG Ratio: 1.3 (calc) (ref 1.0–2.5)
ALT: 13 U/L (ref 6–29)
AST: 18 U/L (ref 10–35)
Albumin: 4 g/dL (ref 3.6–5.1)
Alkaline phosphatase (APISO): 72 U/L (ref 37–153)
BUN/Creatinine Ratio: 19 (calc) (ref 6–22)
BUN: 18 mg/dL (ref 7–25)
CO2: 31 mmol/L (ref 20–32)
Calcium: 9.7 mg/dL (ref 8.6–10.4)
Chloride: 103 mmol/L (ref 98–110)
Creat: 0.97 mg/dL — ABNORMAL HIGH (ref 0.60–0.88)
GFR, Est African American: 60 mL/min/{1.73_m2} (ref >=60)
GFR, Est Non African American: 51 mL/min/{1.73_m2} — ABNORMAL LOW (ref >=60)
Globulin: 3 g/dL (calc) (ref 1.9–3.7)
Glucose, Bld: 150 mg/dL — ABNORMAL HIGH (ref 65–99)
Potassium: 3.8 mmol/L (ref 3.5–5.3)
Sodium: 142 mmol/L (ref 135–146)
Total Bilirubin: 0.3 mg/dL (ref 0.2–1.2)
Total Protein: 7 g/dL (ref 6.1–8.1)

## 2019-03-16 LAB — CBC WITH DIFFERENTIAL/PLATELET
Absolute Monocytes: 395 cells/uL (ref 200–950)
Basophils Absolute: 30 cells/uL (ref 0–200)
Basophils Relative: 0.5 %
Eosinophils Absolute: 260 cells/uL (ref 15–500)
Eosinophils Relative: 4.4 %
HCT: 41.8 % (ref 35.0–45.0)
Hemoglobin: 13.9 g/dL (ref 11.7–15.5)
Lymphs Abs: 1611 cells/uL (ref 850–3900)
MCH: 30.8 pg (ref 27.0–33.0)
MCHC: 33.3 g/dL (ref 32.0–36.0)
MCV: 92.7 fL (ref 80.0–100.0)
MPV: 9.8 fL (ref 7.5–12.5)
Monocytes Relative: 6.7 %
Neutro Abs: 3605 cells/uL (ref 1500–7800)
Neutrophils Relative %: 61.1 %
Platelets: 194 10*3/uL (ref 140–400)
RBC: 4.51 10*6/uL (ref 3.80–5.10)
RDW: 12.1 % (ref 11.0–15.0)
Total Lymphocyte: 27.3 %
WBC: 5.9 10*3/uL (ref 3.8–10.8)

## 2019-03-16 NOTE — Progress Notes (Deleted)
Careteam: Patient Care Team: [name], NP as PCP - General (Geriatric Medicine)  Advanced Directive information    Allergies  Allergen Reactions  . Corn-Containing Products   . Other Hives    Grapes and banana concentrate  . Prilosec [Omeprazole]   . Phenobarbital Rash    rash  . Sulfa Antibiotics Rash    No chief complaint on file.    HPI: Patient is a 86 y.o. female ***  Review of Systems:  ROS***  Past Medical History:  Diagnosis Date  . Brittle nails   . CAD (coronary artery disease)    mild by cath in 2010  . Cellulitis   . Cellulitis of left anterior lower leg   . Chronic kidney disease, stage II (mild)   . Dementia with behavioral disturbance ([place])   . Depression with anxiety 05/25/2007  . Dyslipidemia   . GERD (gastroesophageal reflux disease)   . Hallucinations   . Head ache   . Hordeolum externum of right eye   . Hypertension   . Leg ulcer, left, limited to breakdown of skin ([place])   . Mitral insufficiency   . MVP (mitral valve prolapse)   . Occult blood in stools   . Open wound of lower leg, right, sequela   . Open wound of lower leg, right, subsequent encounter    stage 3 ulcer  . Osteopenia   . Skin lesion of hand   . Urinary frequency   . Urticaria   . Vertigo    Past Surgical History:  Procedure Laterality Date  . APPENDECTOMY    . CARDIAC CATHETERIZATION  06/16/2008   normal LV systolic function, MVP with some mild MR, prox LAD disease (50%) - Dr. Loni Muse. [facility]  . CARDIAC CATHETERIZATION  10/16/2005   normal LV systolic function, MVP with trace to 1+ MR, mild nonobstructive CAD involving LAD 30% prox, 10-20 narrowing in prox LAD (Dr. Corky Downs)  . CATARACT EXTRACTION, BILATERAL    . CHOLECYSTECTOMY    . NM MYOCAR PERF WALL MOTION  02/2011   lexiscan myoview; perfusion dfect of 5% of LV myocardium, small area of anteroseptal breast attenuation artifact, no reversible ischemia, EF 81%, abnormal but low risk scan  . TONSILLECTOMY      Social History:   reports that she has never smoked. She has never used smokeless tobacco. She reports that she does not drink alcohol or use drugs.  Family History  Problem Relation Age of Onset  . Heart attack Mother 53  . Pneumonia Father 13  . Alzheimer's disease Father   . Diabetes Sister 82  . Heart Problems Brother 39  . Dementia Sister 43  . Pneumonia Sister        50 weeks old  . Dementia Sister 21  . Alzheimer's disease Sister 19    Medications: Patient's Medications  New Prescriptions   No medications on file  Previous Medications   ACETAMINOPHEN (TYLENOL) 325 MG TABLET    Take 325 mg by mouth as needed (back pain).   AMOXICILLIN (AMOXIL) 500 MG CAPSULE    Take 4 capsules by mouth 1 hour prior to dental appointment every 4 months   ASPIRIN 81 MG TABLET    Take 81 mg by mouth daily.   CALCIUM CARBONATE-VITAMIN D (CALTRATE 600+D) 600-400 MG-UNIT TABLET    Take 1 tablet by mouth 2 (two) times daily.   DONEPEZIL (ARICEPT) 10 MG TABLET    TAKE 1 TABLET BY MOUTH AT  BEDTIME  MEMANTINE (NAMENDA) 10 MG TABLET    TAKE 1 TABLET BY MOUTH  TWICE DAILY   METOPROLOL SUCCINATE (TOPROL-XL) 25 MG 24 HR TABLET    TAKE ONE-HALF TABLET BY  MOUTH DAILY   MULTIPLE VITAMINS-MINERALS (CENTRUM SILVER PO)    Take 1 tablet by mouth daily.    PRAVASTATIN (PRAVACHOL) 40 MG TABLET    TAKE 1 TABLET BY MOUTH  DAILY  Modified Medications   No medications on file  Discontinued Medications   No medications on file    Physical Exam:  There were no vitals filed for this visit. There is no height or weight on file to calculate BMI. Wt Readings from Last 3 Encounters:  12/14/17 125 lb (56.7 kg)  07/27/17 120 lb (54.4 kg)  05/05/17 117 lb 8 oz (53.3 kg)    Physical Exam***  Labs reviewed: Basic Metabolic Panel: Recent Labs    05/14/18 1042  NA 139  K 4.1  CL 101  CO2 31  GLUCOSE 106*  BUN 14  CREATININE 0.79  CALCIUM 9.7   Liver Function Tests: Recent Labs    05/14/18 1042   AST 20  ALT 13  BILITOT 0.5  PROT 7.1   No results for input(s): LIPASE, AMYLASE in the last 8760 hours. No results for input(s): AMMONIA in the last 8760 hours. CBC: Recent Labs    05/14/18 1042  WBC 4.8  NEUTROABS 2,650  HGB 14.3  HCT 42.3  MCV 90.6  PLT 238   Lipid Panel: Recent Labs    05/14/18 1042  CHOL 162  HDL 58  LDLCALC 83  TRIG 114  CHOLHDL 2.8   TSH: No results for input(s): TSH in the last 8760 hours. A1C: No results found for: HGBA1C   Assessment/Plan There are no diagnoses linked to this encounter.  Next appt: *** Rishawn Walck K. Biagio Borg  [facility] [phone]

## 2019-03-16 NOTE — Patient Instructions (Addendum)
  4 week for virtual visit for MOST form completion.   teepa snow has videos on youtube that could help with care   COVID-19 Vaccine Information can be found at: [email or web address] For questions related to vaccine distribution or appointments, please email [email or web address] or call [phone].

## 2019-03-16 NOTE — Progress Notes (Signed)
Careteam: Patient Care Team: [name], NP as PCP - General (Geriatric Medicine)  Advanced Directive information Does Patient Have a Medical Advance Directive?: Yes, Type of Advance Directive: Out of facility DNR (pink MOST or yellow form), Does patient want to make changes to medical advance directive?: No - Patient declined  Allergies  Allergen Reactions  . Corn-Containing Products   . Other Hives    Grapes and banana concentrate  . Prilosec [Omeprazole]   . Phenobarbital Rash    rash  . Sulfa Antibiotics Rash    Chief Complaint  Patient presents with  . Medical Management of Chronic Issues    6 month follow-up. Here with daughter [place]   . Back Pain    Patient c/o back pain, tylenol 325 mg not helping   . Hygiene    Patient does not like to take a bath   . Hearing Problem    Decreased hearing      HPI: Patient is a 86 y.o. female for routine follow up with her daughter.   Back pain - daughter states that she complains of back pain almost every day. Not new, had imagining done in the past and daughter reports they said there were some changes to the spine at that time. Takes tylenol 1-2 times per day which helps.    Osteopenia - continues to take calcium and vitamin D.  Dementia - she has been stable. Lives with daughter. The center that she used to attend has still not opened. She has been staying at home. Daughter stays that she walks around the house for exercise. She also reads, does puzzles, and watches tv to stay stimulated. Patient is content staying at home and does not want to go anywhere. Daughter states that she has been eating well. Does not like to bath, just likes to take "bird bathes"  Daughter has a shower chair for her that they use but patient will refuse to shower some days.   Patient sleeps well at night.   Hyperlipidemia - continues on pravastatin    Hypertension - well controlled on exam, continues on Toprol.    Review of Systems:   Review of Systems  Constitutional: Negative for chills, fever and weight loss.  HENT: Positive for hearing loss.   Respiratory: Negative for cough and shortness of breath.   Cardiovascular: Negative for chest pain and leg swelling.  Gastrointestinal: Negative for abdominal pain, constipation and diarrhea.  Genitourinary: Negative.   Musculoskeletal: Positive for back pain.  Skin:       Dry  Endo/Heme/Allergies: Bruises/bleeds easily.  Psychiatric/Behavioral: Positive for memory loss.    Past Medical History:  Diagnosis Date  . Brittle nails   . CAD (coronary artery disease)    mild by cath in 2010  . Cellulitis   . Cellulitis of left anterior lower leg   . Chronic kidney disease, stage II (mild)   . Dementia with behavioral disturbance ([place])   . Depression with anxiety 05/25/2007  . Dyslipidemia   . GERD (gastroesophageal reflux disease)   . Hallucinations   . Head ache   . Hordeolum externum of right eye   . Hypertension   . Leg ulcer, left, limited to breakdown of skin ([place])   . Mitral insufficiency   . MVP (mitral valve prolapse)   . Occult blood in stools   . Open wound of lower leg, right, sequela   . Open wound of lower leg, right, subsequent encounter    stage  3 ulcer  . Osteopenia   . Skin lesion of hand   . Urinary frequency   . Urticaria   . Vertigo    Past Surgical History:  Procedure Laterality Date  . APPENDECTOMY    . CARDIAC CATHETERIZATION  06/16/2008   normal LV systolic function, MVP with some mild MR, prox LAD disease (50%) - Dr. Loni Muse. [place]  . CARDIAC CATHETERIZATION  10/16/2005   normal LV systolic function, MVP with trace to 1+ MR, mild nonobstructive CAD involving LAD 30% prox, 10-20 narrowing in prox LAD (Dr. Corky Downs)  . CATARACT EXTRACTION, BILATERAL    . CHOLECYSTECTOMY    . NM MYOCAR PERF WALL MOTION  02/2011   lexiscan myoview; perfusion dfect of 5% of LV myocardium, small area of anteroseptal breast attenuation artifact, no reversible  ischemia, EF 81%, abnormal but low risk scan  . TONSILLECTOMY     Social History:   reports that she has never smoked. She has never used smokeless tobacco. She reports that she does not drink alcohol or use drugs.  Family History  Problem Relation Age of Onset  . Heart attack Mother 33  . Pneumonia Father 52  . Alzheimer's disease Father   . Diabetes Sister 47  . Heart Problems Brother 37  . Dementia Sister 28  . Pneumonia Sister        56 weeks old  . Dementia Sister 40  . Alzheimer's disease Sister 53    Medications: Patient's Medications  New Prescriptions   No medications on file  Previous Medications   ACETAMINOPHEN (TYLENOL) 325 MG TABLET    Take 325 mg by mouth as needed (back pain).   AMOXICILLIN (AMOXIL) 500 MG CAPSULE    Take 4 capsules by mouth 1 hour prior to dental appointment every 4 months   ASPIRIN 81 MG TABLET    Take 81 mg by mouth daily.   CALCIUM CARBONATE-VITAMIN D (CALTRATE 600+D) 600-400 MG-UNIT TABLET    Take 1 tablet by mouth 2 (two) times daily.   DONEPEZIL (ARICEPT) 10 MG TABLET    TAKE 1 TABLET BY MOUTH AT  BEDTIME   MEMANTINE (NAMENDA) 10 MG TABLET    TAKE 1 TABLET BY MOUTH  TWICE DAILY   METOPROLOL SUCCINATE (TOPROL-XL) 25 MG 24 HR TABLET    TAKE ONE-HALF TABLET BY  MOUTH DAILY   MULTIPLE VITAMINS-MINERALS (CENTRUM SILVER PO)    Take 1 tablet by mouth daily.    PRAVASTATIN (PRAVACHOL) 40 MG TABLET    TAKE 1 TABLET BY MOUTH  DAILY  Modified Medications   No medications on file  Discontinued Medications   No medications on file    Physical Exam:  Vitals:   03/16/19 1456  BP: 122/78  Pulse: (!) 50  Temp: (!) 96.6 F (35.9 C)  TempSrc: Temporal  SpO2: 99%  Weight: 124 lb 9.6 oz (56.5 kg)  Height: '5\' 5"'$  (1.651 m)   Body mass index is 20.73 kg/m. Wt Readings from Last 3 Encounters:  03/16/19 124 lb 9.6 oz (56.5 kg)  12/14/17 125 lb (56.7 kg)  07/27/17 120 lb (54.4 kg)    Physical Exam Constitutional:      Appearance: Normal  appearance.  HENT:     Head: Normocephalic and atraumatic.  Cardiovascular:     Rate and Rhythm: Normal rate and regular rhythm.     Pulses: Normal pulses.     Heart sounds: Normal heart sounds.  Pulmonary:     Effort: Pulmonary effort is normal.  Breath sounds: Normal breath sounds.  Abdominal:     General: Bowel sounds are normal.     Palpations: Abdomen is soft.  Musculoskeletal:        General: No tenderness.     Cervical back: Normal range of motion and neck supple.     Right lower leg: No edema.     Left lower leg: No edema.  Skin:    General: Skin is warm and dry.     Findings: Bruising present.  Neurological:     Mental Status: She is alert. Mental status is at baseline.     Labs reviewed: Basic Metabolic Panel: Recent Labs    05/14/18 1042  NA 139  K 4.1  CL 101  CO2 31  GLUCOSE 106*  BUN 14  CREATININE 0.79  CALCIUM 9.7   Liver Function Tests: Recent Labs    05/14/18 1042  AST 20  ALT 13  BILITOT 0.5  PROT 7.1   No results for input(s): LIPASE, AMYLASE in the last 8760 hours. No results for input(s): AMMONIA in the last 8760 hours. CBC: Recent Labs    05/14/18 1042  WBC 4.8  NEUTROABS 2,650  HGB 14.3  HCT 42.3  MCV 90.6  PLT 238   Lipid Panel: Recent Labs    05/14/18 1042  CHOL 162  HDL 58  LDLCALC 83  TRIG 114  CHOLHDL 2.8   TSH: No results for input(s): TSH in the last 8760 hours. A1C: No results found for: HGBA1C   Assessment/Plan 1. Senile purpura ([place]) -following - CBC with Differential/Platelet  2. Essential hypertension Blood pressure is stable on exam today. Continue on current regimen.   3. Dementia with behavioral disturbance, unspecified dementia type ([place]) Stable with no significant changes. Continue on Aricept and namenda. Encouraged daughter to look up videos from [employer] online to help with tips and tricks on dementia patients.   4. Osteopenia, unspecified location Continues to take calcium and  vitamin D.   5. Insomnia, unspecified type Patient is sleeping very well at night. Not needing medications at this time.   6. Coronary artery disease due to lipid rich plaque Stable, continues on aspirin.  - CMP with eGFR([facility]) - CBC with Differential/Platelet - Lipid Panel  7. Dyslipidemia Continues on Pravachol 40 mg daily. Will discuss possibility of coming off of medication at next visit. - CMP with eGFR([facility]) - Lipid Panel  8. Low back pain -non tender to palpitation, still walking without difficulties and no warning signs.  -reassured daughter tylenol was safe to continue daily - can use tylenol 500 mg 1-2 tablets every 8 hours as needed   Next appt: 4 weeks for MOST form 6 months for routine follow up  [place]. Tarvares Lant, [facility] [phone]   I personally was present during the history, physical exam and medical decision-making activities of this service and have verified that the service and findings are accurately documented in the student's note

## 2019-04-11 ENCOUNTER — Telehealth (INDEPENDENT_AMBULATORY_CARE_PROVIDER_SITE_OTHER): Payer: Medicare Other | Admitting: Nurse Practitioner

## 2019-04-11 ENCOUNTER — Other Ambulatory Visit: Payer: Self-pay

## 2019-04-11 ENCOUNTER — Encounter: Payer: Self-pay | Admitting: Nurse Practitioner

## 2019-04-11 DIAGNOSIS — Z7189 Other specified counseling: Secondary | ICD-10-CM

## 2019-04-11 NOTE — Progress Notes (Signed)
This service is provided via telemedicine  No vital signs collected/recorded due to the encounter was a telemedicine visit.   Location of patient (ex: home, work):  Home  Patient consents to a telephone visit:  Yes  Location of the provider (ex: office, home):  [facility], Office   Name of any referring provider:  N/A  Names of all persons participating in the telemedicine service and their role in the encounter:  S.Chrae B/CMA, Sherrie Mustache, NP, Patient, and Daughter Olin Hauser.  Time spent on call: 5 min with medical assistant      Careteam: Patient Care Team: [name], NP as PCP - General (Geriatric Medicine)  PLACE OF SERVICE:  [place] Directive information Does Patient Have a Medical Advance Directive?: Yes, Type of Advance Directive: Out of facility DNR (pink MOST or yellow form);[place];Living will, Does patient want to make changes to medical advance directive?: No - Patient declined  Allergies  Allergen Reactions  . Corn-Containing Products   . Other Hives    Grapes and banana concentrate  . Prilosec [Omeprazole]   . Phenobarbital Rash    rash  . Sulfa Antibiotics Rash    Chief Complaint  Patient presents with  . Advanced Directive    Advance Care Planning, discuss MOST form. Telehealth/Video Visit      HPI: Patient is a 86 y.o. female to complete MOST Form. Unable to connect via virtual visit therefore phone visit was done.  Daughter is her POA and legal guardian due to dementia She reports they have talked about this in the past and knows her wishes.   MOST from reviewed with daughter in vynca and all questions answered  Review of Systems:  Review of Systems  Unable to perform ROS: Dementia   Past Medical History:  Diagnosis Date  . Brittle nails   . CAD (coronary artery disease)    mild by cath in 2010  . Cellulitis   . Cellulitis of left anterior lower leg   . Chronic kidney disease,  stage II (mild)   . Dementia with behavioral disturbance ([place])   . Depression with anxiety 05/25/2007  . Dyslipidemia   . GERD (gastroesophageal reflux disease)   . Hallucinations   . Head ache   . Hordeolum externum of right eye   . Hypertension   . Leg ulcer, left, limited to breakdown of skin ([place])   . Mitral insufficiency   . MVP (mitral valve prolapse)   . Occult blood in stools   . Open wound of lower leg, right, sequela   . Open wound of lower leg, right, subsequent encounter    stage 3 ulcer  . Osteopenia   . Skin lesion of hand   . Urinary frequency   . Urticaria   . Vertigo    Past Surgical History:  Procedure Laterality Date  . APPENDECTOMY    . CARDIAC CATHETERIZATION  06/16/2008   normal LV systolic function, MVP with some mild MR, prox LAD disease (50%) - Dr. Loni Muse. [place]  . CARDIAC CATHETERIZATION  10/16/2005   normal LV systolic function, MVP with trace to 1+ MR, mild nonobstructive CAD involving LAD 30% prox, 10-20 narrowing in prox LAD (Dr. Corky Downs)  . CATARACT EXTRACTION, BILATERAL    . CHOLECYSTECTOMY    . NM MYOCAR PERF WALL MOTION  02/2011   lexiscan myoview; perfusion dfect of 5% of LV myocardium, small area of anteroseptal breast attenuation artifact, no reversible ischemia, EF  81%, abnormal but low risk scan  . TONSILLECTOMY     Social History:   reports that she has never smoked. She has never used smokeless tobacco. She reports that she does not drink alcohol or use drugs.  Family History  Problem Relation Age of Onset  . Heart attack Mother 55  . Pneumonia Father 1  . Alzheimer's disease Father   . Diabetes Sister 52  . Heart Problems Brother 23  . Dementia Sister 100  . Pneumonia Sister        102 weeks old  . Dementia Sister 49  . Alzheimer's disease Sister 61    Medications: Patient's Medications  New Prescriptions   No medications on file  Previous Medications   ACETAMINOPHEN (TYLENOL) 325 MG TABLET    Take 325 mg by mouth as needed  (back pain).   AMOXICILLIN (AMOXIL) 500 MG CAPSULE    Take 4 capsules by mouth 1 hour prior to dental appointment every 4 months   ASPIRIN 81 MG TABLET    Take 81 mg by mouth daily.   CALCIUM CARBONATE-VITAMIN D (CALTRATE 600+D) 600-400 MG-UNIT TABLET    Take 1 tablet by mouth 2 (two) times daily.   DONEPEZIL (ARICEPT) 10 MG TABLET    TAKE 1 TABLET BY MOUTH AT  BEDTIME   MEMANTINE (NAMENDA) 10 MG TABLET    TAKE 1 TABLET BY MOUTH  TWICE DAILY   METOPROLOL SUCCINATE (TOPROL-XL) 25 MG 24 HR TABLET    TAKE ONE-HALF TABLET BY  MOUTH DAILY   MULTIPLE VITAMINS-MINERALS (CENTRUM SILVER PO)    Take 1 tablet by mouth daily.    PRAVASTATIN (PRAVACHOL) 40 MG TABLET    TAKE 1 TABLET BY MOUTH  DAILY  Modified Medications   No medications on file  Discontinued Medications   No medications on file    Physical Exam:  There were no vitals filed for this visit. There is no height or weight on file to calculate BMI. Wt Readings from Last 3 Encounters:  03/16/19 124 lb 9.6 oz (56.5 kg)  12/14/17 125 lb (56.7 kg)  07/27/17 120 lb (54.4 kg)      Labs reviewed: Basic Metabolic Panel: Recent Labs    05/14/18 1042 03/16/19 1537  NA 139 142  K 4.1 3.8  CL 101 103  CO2 31 31  GLUCOSE 106* 150*  BUN 14 18  CREATININE 0.79 0.97*  CALCIUM 9.7 9.7   Liver Function Tests: Recent Labs    05/14/18 1042 03/16/19 1537  AST 20 18  ALT 13 13  BILITOT 0.5 0.3  PROT 7.1 7.0   No results for input(s): LIPASE, AMYLASE in the last 8760 hours. No results for input(s): AMMONIA in the last 8760 hours. CBC: Recent Labs    05/14/18 1042 03/16/19 1537  WBC 4.8 5.9  NEUTROABS 2,650 3,605  HGB 14.3 13.9  HCT 42.3 41.8  MCV 90.6 92.7  PLT 238 194   Lipid Panel: Recent Labs    05/14/18 1042 03/16/19 1537  CHOL 162 169  HDL 58 49*  LDLCALC 83 85  TRIG 114 264*  CHOLHDL 2.8 3.4   TSH: No results for input(s): TSH in the last 8760 hours. A1C: No results found for:  HGBA1C   Assessment/Plan 1. Advanced directives, counseling/discussion -pt is DNR, reviewed MOST form with pts daughter and completed in VYNCA  -pink copy will be given as well.   Next appt: 05/16/2019 as scheduled.  Janene Harvey. Biagio Borg  [facility] [phone]    Virtual Visit via Telephone Note  I connected with pt and daughter on 04/12/19 at  2:15 PM EDT by telephone and verified that I am speaking with the correct person using two identifiers.  Location: Patient: home Provider: office   I discussed the limitations, risks, security and privacy concerns of performing an evaluation and management service by telephone and the availability of in person appointments. I also discussed with the patient that there may be a patient responsible charge related to this service. The patient expressed understanding and agreed to proceed.   I discussed the assessment and treatment plan with the patient. The patient was provided an opportunity to ask questions and all were answered. The patient agreed with the plan and demonstrated an understanding of the instructions.   The patient was advised to call back or seek an in-person evaluation if the symptoms worsen or if the condition fails to improve as anticipated.  I provided 30 minutes of non-face-to-face time during this encounter.  Janene Harvey. Biagio Borg Avs printed and mailed

## 2019-05-16 ENCOUNTER — Other Ambulatory Visit: Payer: Self-pay

## 2019-05-16 ENCOUNTER — Ambulatory Visit (INDEPENDENT_AMBULATORY_CARE_PROVIDER_SITE_OTHER): Payer: Medicare Other | Admitting: Nurse Practitioner

## 2019-05-16 ENCOUNTER — Encounter: Payer: Self-pay | Admitting: Nurse Practitioner

## 2019-05-16 DIAGNOSIS — E2839 Other primary ovarian failure: Secondary | ICD-10-CM

## 2019-05-16 DIAGNOSIS — Z0000 Encounter for general adult medical examination without abnormal findings: Secondary | ICD-10-CM

## 2019-05-16 NOTE — Progress Notes (Signed)
Subjective:   Julia Henson is a 86 y.o. female who presents for Medicare Annual (Subsequent) preventive examination.  Review of Systems:   Cardiac Risk Factors include: hypertension;advanced age (>44men, >69 women)     Objective:     Vitals: There were no vitals taken for this visit.  There is no height or weight on file to calculate BMI.  Advanced Directives 05/16/2019 04/11/2019 03/16/2019 09/14/2018 05/12/2018 07/27/2017 04/27/2017  Does Patient Have a Medical Advance Directive? Yes Yes Yes Yes Yes Yes Yes  Type of Estate agent of [place];Living will;Out of facility DNR (pink MOST or yellow form) Out of facility DNR (pink MOST or yellow form);Healthcare Power of [place];Living will Out of facility DNR (pink MOST or yellow form) Out of facility DNR (pink MOST or yellow form) Healthcare Power of [place];Living will;Out of facility DNR (pink MOST or yellow form) Out of facility DNR (pink MOST or yellow form) Out of facility DNR (pink MOST or yellow form)  Does patient want to make changes to medical advance directive? No - Patient declined No - Patient declined No - Patient declined No - Patient declined - No - Patient declined -  Copy of Healthcare Power of Attorney in Chart? Yes - validated most recent copy scanned in chart (See row information) No - copy requested - - Yes - validated most recent copy scanned in chart (See row information) - -  Pre-existing out of facility DNR order (yellow form or pink MOST form) - - - Yellow form placed in chart (order not valid for inpatient use) Yellow form placed in chart (order not valid for inpatient use) Yellow form placed in chart (order not valid for inpatient use) Yellow form placed in chart (order not valid for inpatient use)    Tobacco Social History   Tobacco Use  Smoking Status Never Smoker  Smokeless Tobacco Never Used     Counseling given: Not Answered   Clinical Intake:  Pre-visit preparation completed:  Yes  Pain : No/denies pain     BMI - recorded: 20 Nutritional Status: BMI of 19-24  Normal Nutritional Risks: None Diabetes: No  How often do you need to have someone help you when you read instructions, pamphlets, or other written materials from your doctor or pharmacy?: 5 - Always        Past Medical History:  Diagnosis Date  . Brittle nails   . CAD (coronary artery disease)    mild by cath in 2010  . Cellulitis   . Cellulitis of left anterior lower leg   . Chronic kidney disease, stage II (mild)   . Dementia with behavioral disturbance ([facility])   . Depression with anxiety 05/25/2007  . Dyslipidemia   . GERD (gastroesophageal reflux disease)   . Hallucinations   . Head ache   . Hordeolum externum of right eye   . Hypertension   . Leg ulcer, left, limited to breakdown of skin ([facility])   . Mitral insufficiency   . MVP (mitral valve prolapse)   . Occult blood in stools   . Open wound of lower leg, right, sequela   . Open wound of lower leg, right, subsequent encounter    stage 3 ulcer  . Osteopenia   . Skin lesion of hand   . Urinary frequency   . Urticaria   . Vertigo    Past Surgical History:  Procedure Laterality Date  . APPENDECTOMY    . CARDIAC CATHETERIZATION  06/16/2008   normal LV  systolic function, MVP with some mild MR, prox LAD disease (50%) - Dr. Mervyn Skeeters. [facility]  . CARDIAC CATHETERIZATION  10/16/2005   normal LV systolic function, MVP with trace to 1+ MR, mild nonobstructive CAD involving LAD 30% prox, 10-20 narrowing in prox LAD (Dr. Bishop Limbo)  . CATARACT EXTRACTION, BILATERAL    . CHOLECYSTECTOMY    . NM MYOCAR PERF WALL MOTION  02/2011   lexiscan myoview; perfusion dfect of 5% of LV myocardium, small area of anteroseptal breast attenuation artifact, no reversible ischemia, EF 81%, abnormal but low risk scan  . TONSILLECTOMY     Family History  Problem Relation Age of Onset  . Heart attack Mother 26  . Pneumonia Father 23  . Alzheimer's disease Father   .  Diabetes Sister 2  . Heart Problems Brother 48  . Dementia Sister 56  . Pneumonia Sister        52 weeks old  . Dementia Sister 29  . Alzheimer's disease Sister 69   Social History   Socioeconomic History  . Marital status: Widowed    Spouse name: Not on file  . Number of children: 1  . Years of education: 44  . Highest education level: Not on file  Occupational History    Employer: RETIRED  Tobacco Use  . Smoking status: Never Smoker  . Smokeless tobacco: Never Used  Substance and Sexual Activity  . Alcohol use: No  . Drug use: No  . Sexual activity: Never  Other Topics Concern  . Not on file  Social History Narrative   Social History      Diet?       Do you drink/eat things with caffeine?  Coffee, once daily      Marital status?       widowed                             What year were you married? 1949   Children- 1   Do you live in a house, apartment, assisted living, condo, trailer, etc.?    05/05/17 living with dgtr, Ephraim Hamburger      Is it one or more stories? one      How many persons live in your home? 2      Do you have any pets in your home? (please list) yes, Chinmayi's dog-Susie and daughter's dog Molly      Highest level of education completed? 12      Current or past profession: [employer]      Do you exercise?              yes                        Type & how often? Chair exercises at [facility] [employer] in [place]      Advanced Directives      Do you have a living will? yes      Do you have a DNR form?                                  If not, do you want to discuss one?      Do you have signed POA/HPOA for forms?  yes      Functional Status      Do you have difficulty bathing or dressing yourself?  no      Do you have difficulty preparing food or eating? No- daughter prepares food now      Do you have difficulty managing your medications? yes      Do you have difficulty managing your finances? yes      Do you have difficulty  affording your medications? No-insurance   Social Determinants of Health   Financial Resource Strain:   . Difficulty of Paying Living Expenses:   Food Insecurity:   . Worried About Programme researcher, broadcasting/film/video in the Last Year:   . Barista in the Last Year:   Transportation Needs:   . Freight forwarder (Medical):   Marland Kitchen Lack of Transportation (Non-Medical):   Physical Activity:   . Days of Exercise per Week:   . Minutes of Exercise per Session:   Stress:   . Feeling of Stress :   Social Connections:   . Frequency of Communication with Friends and Family:   . Frequency of Social Gatherings with Friends and Family:   . Attends Religious Services:   . Active Member of Clubs or Organizations:   . Attends Banker Meetings:   Marland Kitchen Marital Status:     Outpatient Encounter Medications as of 05/16/2019  Medication Sig  . acetaminophen (TYLENOL) 325 MG tablet Take 325 mg by mouth as needed (back pain).  Marland Kitchen ALPRAZolam (XANAX) 0.5 MG tablet Take 1 tablet by mouth at bedtime.  Marland Kitchen amoxicillin (AMOXIL) 500 MG capsule Take 4 capsules by mouth 1 hour prior to dental appointment every 4 months  . aspirin 81 MG tablet Take 81 mg by mouth daily.  . Calcium Carbonate-Vitamin D (CALTRATE 600+D) 600-400 MG-UNIT tablet Take 1 tablet by mouth 2 (two) times daily.  Marland Kitchen donepezil (ARICEPT) 10 MG tablet TAKE 1 TABLET BY MOUTH AT  BEDTIME  . memantine (NAMENDA) 10 MG tablet TAKE 1 TABLET BY MOUTH  TWICE DAILY  . metoprolol succinate (TOPROL-XL) 25 MG 24 hr tablet TAKE ONE-HALF TABLET BY  MOUTH DAILY  . Multiple Vitamins-Minerals (CENTRUM SILVER PO) Take 1 tablet by mouth daily.   . pravastatin (PRAVACHOL) 40 MG tablet TAKE 1 TABLET BY MOUTH  DAILY  . prednisoLONE (PRELONE) 15 MG/5ML syrup Take by mouth.   No facility-administered encounter medications on file as of 05/16/2019.    Activities of Daily Living In your present state of health, do you have any difficulty performing the following  activities: 05/16/2019  Hearing? Y  Vision? N  Difficulty concentrating or making decisions? Y  Walking or climbing stairs? Y  Dressing or bathing? Y  Comment daughter helps  Doing errands, shopping? Y  Comment daughter takes her to appts and does Psychologist, clinical and eating ? Y  Using the Toilet? N  In the past six months, have you accidently leaked urine? N  Do you have problems with loss of bowel control? N  Managing your Medications? Y  Managing your Finances? Y  Housekeeping or managing your Housekeeping? Y  Some recent data might be hidden    Patient Care Team: Sharon Seller, NP as PCP - General (Geriatric Medicine)    Assessment:   This is a routine wellness examination for Julia Henson.  Exercise Activities and Dietary recommendations Current Exercise Habits: The patient does not participate in regular exercise at present  Goals   None     Fall Risk Fall Risk  05/16/2019 04/11/2019 09/14/2018 05/12/2018 05/12/2018  Falls in the past year? 0 0  1 0 0  Number falls in past yr: 0 0 0 0 0  Injury with Fall? 0 0 0 0 0  Comment - - - - -  Risk for fall due to : - - - - -  Risk for fall due to: Comment - - - - -   Is the patient's home free of loose throw rugs in walkways, pet beds, electrical cords, etc?   yes      Grab bars in the bathroom? no      Handrails on the stairs?   no      Adequate lighting?   yes  Timed Get Up and Go performed: na  Depression Screen PHQ 2/9 Scores 05/16/2019 05/12/2018 05/12/2018 07/27/2017  PHQ - 2 Score 0 0 0 0     Cognitive Function MMSE - Mini Mental State Exam 05/05/2017 03/30/2017  Orientation to time 0 3  Orientation to Place 4 3  Registration 2 0  Attention/ Calculation 1 0  Recall 0 2  Language- name 2 objects 2 2  Language- repeat 0 0  Language- follow 3 step command 3 3  Language- read & follow direction 1 1  Write a sentence 0 1  Copy design 1 1  Total score 14 16     6CIT Screen 05/16/2019 05/12/2018  What  Year? 4 points 0 points  What month? 3 points 3 points  What time? 3 points 0 points  Count back from 20 0 points 0 points  Months in reverse 4 points 2 points  Repeat phrase 10 points 10 points  Total Score 24 15    Immunization History  Administered Date(s) Administered  . Fluad Quad(high Dose 65+) 10/10/2018  . Influenza, High Dose Seasonal PF 12/14/2017  . Influenza-Unspecified 12/20/2012, 02/16/2015  . Pneumococcal Conjugate-13 02/01/2014  . Pneumococcal Polysaccharide-23 01/20/1994  . Tdap 05/07/2011  . Zoster Recombinat (Shingrix) 09/19/2016    Qualifies for Shingles Vaccine?recommended.   Screening Tests Health Maintenance  Topic Date Due  . COVID-19 Vaccine (1) Never done  . INFLUENZA VACCINE  08/21/2019  . TETANUS/TDAP  05/06/2021  . DEXA SCAN  Completed  . PNA vac Low Risk Adult  Completed    Cancer Screenings: Lung: Low Dose CT Chest recommended if Age 52-80 years, 30 pack-year currently smoking OR have quit w/in 15years. Patient does not qualify. Breast:  Up to date on Mammogram? Aged out Up to date of Bone Density/Dexa? Yes Colorectal: aged out  Additional Screenings: na Hepatitis C Screening:      Plan:      I have personally reviewed and noted the following in the patient's chart:   . Medical and social history . Use of alcohol, tobacco or illicit drugs  . Current medications and supplements . Functional ability and status . Nutritional status . Physical activity . Advanced directives . List of other physicians . Hospitalizations, surgeries, and ER visits in previous 12 months . Vitals . Screenings to include cognitive, depression, and falls . Referrals and appointments  In addition, I have reviewed and discussed with patient certain preventive protocols, quality metrics, and best practice recommendations. A written personalized care plan for preventive services as well as general preventive health recommendations were provided to patient.      [name], NP  05/16/2019

## 2019-05-16 NOTE — Patient Instructions (Addendum)
Julia Henson , Thank you for taking time to come for your Medicare Wellness Visit. I appreciate your ongoing commitment to your health goals. Please review the following plan we discussed and let me know if I can assist you in the future.   Screening recommendations/referrals: Colonoscopy aged out Mammogram aged out Bone Density ordered, due in July Recommended yearly ophthalmology/optometry visit for glaucoma screening and checkup Recommended yearly dental visit for hygiene and checkup  Vaccinations: Influenza vaccine up to date Pneumococcal vaccine up to date Tdap vaccine up to date Shingles vaccine- recommend to get 2nd Shingrix vaccine at local pharmacy    Advanced directives: on file.   Conditions/risks identified: progressive memory loss, weakness and risk for falls.   Next appointment: 1 year    Preventive Care 39 Years and Older, Female Preventive care refers to lifestyle choices and visits with your health care provider that can promote health and wellness. What does preventive care include?  A yearly physical exam. This is also called an annual well check.  Dental exams once or twice a year.  Routine eye exams. Ask your health care provider how often you should have your eyes checked.  Personal lifestyle choices, including:  Daily care of your teeth and gums.  Regular physical activity.  Eating a healthy diet.  Avoiding tobacco and drug use.  Limiting alcohol use.  Practicing safe sex.  Taking low-dose aspirin every day.  Taking vitamin and mineral supplements as recommended by your health care provider. What happens during an annual well check? The services and screenings done by your health care provider during your annual well check will depend on your age, overall health, lifestyle risk factors, and family history of disease. Counseling  Your health care provider may ask you questions about your:  Alcohol use.  Tobacco use.  Drug  use.  Emotional well-being.  Home and relationship well-being.  Sexual activity.  Eating habits.  History of falls.  Memory and ability to understand (cognition).  Work and work Astronomer.  Reproductive health. Screening  You may have the following tests or measurements:  Height, weight, and BMI.  Blood pressure.  Lipid and cholesterol levels. These may be checked every 5 years, or more frequently if you are over 55 years old.  Skin check.  Lung cancer screening. You may have this screening every year starting at age 17 if you have a 30-pack-year history of smoking and currently smoke or have quit within the past 15 years.  Fecal occult blood test (FOBT) of the stool. You may have this test every year starting at age 58.  Flexible sigmoidoscopy or colonoscopy. You may have a sigmoidoscopy every 5 years or a colonoscopy every 10 years starting at age 85.  Hepatitis C blood test.  Hepatitis B blood test.  Sexually transmitted disease (STD) testing.  Diabetes screening. This is done by checking your blood sugar (glucose) after you have not eaten for a while (fasting). You may have this done every 1-3 years.  Bone density scan. This is done to screen for osteoporosis. You may have this done starting at age 40.  Mammogram. This may be done every 1-2 years. Talk to your health care provider about how often you should have regular mammograms. Talk with your health care provider about your test results, treatment options, and if necessary, the need for more tests. Vaccines  Your health care provider may recommend certain vaccines, such as:  Influenza vaccine. This is recommended every year.  Tetanus, diphtheria, and  acellular pertussis (Tdap, Td) vaccine. You may need a Td booster every 10 years.  Zoster vaccine. You may need this after age 58.  Pneumococcal 13-valent conjugate (PCV13) vaccine. One dose is recommended after age 59.  Pneumococcal polysaccharide  (PPSV23) vaccine. One dose is recommended after age 13. Talk to your health care provider about which screenings and vaccines you need and how often you need them. This information is not intended to replace advice given to you by your health care provider. Make sure you discuss any questions you have with your health care provider. Document Released: 02/02/2015 Document Revised: 09/26/2015 Document Reviewed: 11/07/2014 Elsevier Interactive Patient Education  2017 [place] Prevention in the Home Falls can cause injuries. They can happen to people of all ages. There are many things you can do to make your home safe and to help prevent falls. What can I do on the outside of my home?  Regularly fix the edges of walkways and driveways and fix any cracks.  Remove anything that might make you trip as you walk through a door, such as a raised step or threshold.  Trim any bushes or trees on the path to your home.  Use bright outdoor lighting.  Clear any walking paths of anything that might make someone trip, such as rocks or tools.  Regularly check to see if handrails are loose or broken. Make sure that both sides of any steps have handrails.  Any raised decks and porches should have guardrails on the edges.  Have any leaves, snow, or ice cleared regularly.  Use sand or salt on walking paths during winter.  Clean up any spills in your garage right away. This includes oil or grease spills. What can I do in the bathroom?  Use night lights.  Install grab bars by the toilet and in the tub and shower. Do not use towel bars as grab bars.  Use non-skid mats or decals in the tub or shower.  If you need to sit down in the shower, use a plastic, non-slip stool.  Keep the floor dry. Clean up any water that spills on the floor as soon as it happens.  Remove soap buildup in the tub or shower regularly.  Attach bath mats securely with double-sided non-slip rug tape.  Do not have  throw rugs and other things on the floor that can make you trip. What can I do in the bedroom?  Use night lights.  Make sure that you have a light by your bed that is easy to reach.  Do not use any sheets or blankets that are too big for your bed. They should not hang down onto the floor.  Have a firm chair that has side arms. You can use this for support while you get dressed.  Do not have throw rugs and other things on the floor that can make you trip. What can I do in the kitchen?  Clean up any spills right away.  Avoid walking on wet floors.  Keep items that you use a lot in easy-to-reach places.  If you need to reach something above you, use a strong step stool that has a grab bar.  Keep electrical cords out of the way.  Do not use floor polish or wax that makes floors slippery. If you must use wax, use non-skid floor wax.  Do not have throw rugs and other things on the floor that can make you trip. What can I do with my stairs?  Do not leave any items on the stairs.  Make sure that there are handrails on both sides of the stairs and use them. Fix handrails that are broken or loose. Make sure that handrails are as long as the stairways.  Check any carpeting to make sure that it is firmly attached to the stairs. Fix any carpet that is loose or worn.  Avoid having throw rugs at the top or bottom of the stairs. If you do have throw rugs, attach them to the floor with carpet tape.  Make sure that you have a light switch at the top of the stairs and the bottom of the stairs. If you do not have them, ask someone to add them for you. What else can I do to help prevent falls?  Wear shoes that:  Do not have high heels.  Have rubber bottoms.  Are comfortable and fit you well.  Are closed at the toe. Do not wear sandals.  If you use a stepladder:  Make sure that it is fully opened. Do not climb a closed stepladder.  Make sure that both sides of the stepladder are  locked into place.  Ask someone to hold it for you, if possible.  Clearly mark and make sure that you can see:  Any grab bars or handrails.  First and last steps.  Where the edge of each step is.  Use tools that help you move around (mobility aids) if they are needed. These include:  Canes.  Walkers.  Scooters.  Crutches.  Turn on the lights when you go into a dark area. Replace any light bulbs as soon as they burn out.  Set up your furniture so you have a clear path. Avoid moving your furniture around.  If any of your floors are uneven, fix them.  If there are any pets around you, be aware of where they are.  Review your medicines with your doctor. Some medicines can make you feel dizzy. This can increase your chance of falling. Ask your doctor what other things that you can do to help prevent falls. This information is not intended to replace advice given to you by your health care provider. Make sure you discuss any questions you have with your health care provider. Document Released: 11/02/2008 Document Revised: 06/14/2015 Document Reviewed: 02/10/2014 Elsevier Interactive Patient Education  2017 [employer].

## 2019-05-16 NOTE — Progress Notes (Signed)
    This service is provided via telemedicine  No vital signs collected/recorded due to the encounter was a telemedicine visit.   Location of patient (ex: home, work): Home.  Patient consents to a telephone visit: Yes  Location of the provider (ex: office, home):  [facility].  Name of any referring provider:N/A  Names of all persons participating in the telemedicine service and their role in the encounter: Patient, Julia Henson Granddaughter in [place], [place], [place], Abbey Chatters, NP.    Time spent on call: 8 minutes spent on the phone with Medical Assistant.

## 2019-05-23 ENCOUNTER — Ambulatory Visit: Payer: Medicare Other | Admitting: Nurse Practitioner

## 2019-05-31 ENCOUNTER — Telehealth: Payer: Self-pay | Admitting: *Deleted

## 2019-05-31 NOTE — Telephone Encounter (Signed)
Megan Notified and agreed.  

## 2019-05-31 NOTE — Telephone Encounter (Signed)
You can send them some information on [facility]; if they are interested in moving her to a SNF for long term care they would need to look into places that interest them, generally facilities will meet with families and give them information. If they want to move her to a facility we would then complete the FL2 form.  Would recommend keeping her on a routine, they may need to encourage her to get out of the bed in the morning and getting her up to eat breakfast,  keeping her busy during the day making sure she gets 3 meals and then going to bed at night around the same time.

## 2019-05-31 NOTE — Telephone Encounter (Signed)
Julia Henson, Family called and stated that patient's Caregiver, POA [name] is in the hospital again. They are trying to take care of patient and was told to call here for any advice or concerns on last OV with [facility].   1. Patient is sleeping enormous amount. Aundra Millet stated she wonders if this is happening because her schedule has been interrupted With [name] being in the hospital. Stated other than this patient is doing well.   2. Requesting resources on the next stages in terms of Home Care or getting patient into a facility.   Please Advise.

## 2019-06-01 ENCOUNTER — Other Ambulatory Visit: Payer: Self-pay | Admitting: Nurse Practitioner

## 2019-06-01 DIAGNOSIS — F0391 Unspecified dementia with behavioral disturbance: Secondary | ICD-10-CM

## 2019-06-02 ENCOUNTER — Telehealth: Payer: Self-pay | Admitting: *Deleted

## 2019-06-02 NOTE — Telephone Encounter (Signed)
Julia Henson Notified and agreed.

## 2019-06-02 NOTE — Telephone Encounter (Signed)
Julia Henson with [facility] called and stated that the family contacted them regarding Hospice Services for patient. She wanted to know if you agree with the Services.  Please Advise.

## 2019-06-02 NOTE — Telephone Encounter (Signed)
I have not talked to the family about hospice, but if the family would like this service and understands what it is about and she qualifies this is okay.

## 2019-06-02 NOTE — Telephone Encounter (Signed)
High risk or very high risk warning populated when attempting to refill medication. RX request sent to PCP for review and approval if warranted.   

## 2019-06-09 ENCOUNTER — Telehealth: Payer: Self-pay | Admitting: Family

## 2019-06-10 ENCOUNTER — Other Ambulatory Visit: Payer: Self-pay

## 2019-06-10 ENCOUNTER — Telehealth (INDEPENDENT_AMBULATORY_CARE_PROVIDER_SITE_OTHER): Payer: Medicare Other | Admitting: Family

## 2019-06-10 ENCOUNTER — Encounter: Payer: Self-pay | Admitting: Family

## 2019-06-10 DIAGNOSIS — R350 Frequency of micturition: Secondary | ICD-10-CM | POA: Diagnosis not present

## 2019-06-10 NOTE — Patient Instructions (Addendum)
-   Encourage fluid intake. - Notify provider if symptoms worsen or running any fever  Urinary Frequency, Adult Urinary frequency means urinating more often than usual. You may urinate every 1-2 hours even though you drink a normal amount of fluid and do not have a bladder infection or condition. Although you urinate more often than normal, the total amount of urine produced in a day is normal. With urinary frequency, you may have an urgent need to urinate often. The stress and anxiety of needing to find a bathroom quickly can make this urge worse. This condition may go away on its own or you may need treatment at home. Home treatment may include bladder training, exercises, taking medicines, or making changes to your diet. Follow these instructions at home: Bladder health   Keep a bladder diary if told by your health care provider. Keep track of: ? What you eat and drink. ? How often you urinate. ? How much you urinate.  Follow a bladder training program if told by your health care provider. This may include: ? Learning to delay going to the bathroom. ? Double urinating (voiding). This helps if you are not completely emptying your bladder. ? Scheduled voiding.  Do Kegel exercises as told by your health care provider. Kegel exercises strengthen the muscles that help control urination, which may help the condition. Eating and drinking  If told by your health care provider, make diet changes, such as: ? Avoiding caffeine. ? Drinking fewer fluids, especially alcohol. ? Not drinking in the evening. ? Avoiding foods or drinks that may irritate the bladder. These include coffee, tea, soda, artificial sweeteners, citrus, tomato-based foods, and chocolate. ? Eating foods that help prevent or ease constipation. Constipation can make this condition worse. Your health care provider may recommend that you:  Drink enough fluid to keep your urine pale yellow.  Take over-the-counter or prescription  medicines.  Eat foods that are high in fiber, such as beans, whole grains, and fresh fruits and vegetables.  Limit foods that are high in fat and processed sugars, such as fried or sweet foods. General instructions  Take over-the-counter and prescription medicines only as told by your health care provider.  Keep all follow-up visits as told by your health care provider. This is important. Contact a health care provider if:  You start urinating more often.  You feel pain or irritation when you urinate.  You notice blood in your urine.  Your urine looks cloudy.  You develop a fever.  You begin vomiting. Get help right away if:  You are unable to urinate. Summary  Urinary frequency means urinating more often than usual. With urinary frequency, you may urinate every 1-2 hours even though you drink a normal amount of fluid and do not have a bladder infection or other bladder condition.  Your health care provider may recommend that you keep a bladder diary, follow a bladder training program, or make dietary changes.  If told by your health care provider, do Kegel exercises to strengthen the muscles that help control urination.  Take over-the-counter and prescription medicines only as told by your health care provider.  Contact a health care provider if your symptoms do not improve or get worse. This information is not intended to replace advice given to you by your health care provider. Make sure you discuss any questions you have with your health care provider. Document Revised: 07/16/2017 Document Reviewed: 07/16/2017 Elsevier Patient Education  2020 [employer].

## 2019-06-10 NOTE — Progress Notes (Signed)
This service is provided via telemedicine  No vital signs collected/recorded due to the encounter was a telemedicine visit.   Location of patient (ex: home, work): Home.  Patient consents to a telephone visit: Yes.  Location of the provider (ex: office, home):  [facility].  Name of any referring provider: N/A  Names of all persons participating in the telemedicine service and their role in the encounter:  Patient, Julia Henson Granddaughter in [place], [place], RMA, [employer], [place], NP.    Time spent on call: 8 minutes spent on the phone with Medical Assistant.   Location:      Place of Service:    Provider: Nikki Rusnak FNP-C  Sharon Seller, NP  Patient Care Team: Sharon Seller, NP as PCP - General (Geriatric Medicine)  Extended Emergency Contact Information Primary Emergency Contact: Serena Croissant Address: [phone], [place] [address] [place] Home Phone: [phone] Mobile Phone: [phone] Relation: Daughter  Code Status:  DNR Goals of care: Advanced Directive information Advanced Directives 06/10/2019  Does Patient Have a Medical Advance Directive? Yes  Type of Estate agent of [place];Living will;Out of facility DNR (pink MOST or yellow form)  Does patient want to make changes to medical advance directive? No - Patient declined  Copy of Healthcare Power of Attorney in Chart? Yes - validated most recent copy scanned in chart (See row information)  Pre-existing out of facility DNR order (yellow form or pink MOST form) -     Chief Complaint  Patient presents with  . Acute Visit    Urine frequency     HPI:  Pt is a 86 y.o. female seen today for an acute visit for evaluation of urine frequency.Patient's granddaughter provides HPI information due to patient's cognitive impairment.she states patient's primary care giver was admitted in the hospital now has another care giver  who states patient has had urine frequency.Granddaughter called to book appointment but later talked with patient's regular care giver states patient has had urine frequency for several years since she has been living with patient.No fever,chills,urgecy,dysuria,abdominal pain,nausea or vomiting.Her appetite has been good.No falls,confusion or agitation.     Past Medical History:  Diagnosis Date  . Brittle nails   . CAD (coronary artery disease)    mild by cath in 2010  . Cellulitis   . Cellulitis of left anterior lower leg   . Chronic kidney disease, stage II (mild)   . Dementia with behavioral disturbance (HCC)   . Depression with anxiety 05/25/2007  . Dyslipidemia   . GERD (gastroesophageal reflux disease)   . Hallucinations   . Head ache   . Hordeolum externum of right eye   . Hypertension   . Leg ulcer, left, limited to breakdown of skin (HCC)   . Mitral insufficiency   . MVP (mitral valve prolapse)   . Occult blood in stools   . Open wound of lower leg, right, sequela   . Open wound of lower leg, right, subsequent encounter    stage 3 ulcer  . Osteopenia   . Skin lesion of hand   . Urinary frequency   . Urticaria   . Vertigo    Past Surgical History:  Procedure Laterality Date  . APPENDECTOMY    . CARDIAC CATHETERIZATION  06/16/2008   normal LV systolic function, MVP with some mild MR, prox LAD disease (50%) - Dr. Mervyn Skeeters. [facility]  .  CARDIAC CATHETERIZATION  10/16/2005   normal LV systolic function, MVP with trace to 1+ MR, mild nonobstructive CAD involving LAD 30% prox, 10-20 narrowing in prox LAD (Dr. Corky Downs)  . CATARACT EXTRACTION, BILATERAL    . CHOLECYSTECTOMY    . NM MYOCAR PERF WALL MOTION  02/2011   lexiscan myoview; perfusion dfect of 5% of LV myocardium, small area of anteroseptal breast attenuation artifact, no reversible ischemia, EF 81%, abnormal but low risk scan  . TONSILLECTOMY      Allergies  Allergen Reactions  . Corn-Containing Products   . Omeprazole  Other (See Comments)  . Other Hives    Grapes and banana concentrate  . Phenobarbital Rash    rash  . Sulfa Antibiotics Rash    Outpatient Encounter Medications as of 06/10/2019  Medication Sig  . acetaminophen (TYLENOL) 325 MG tablet Take 325 mg by mouth as needed (back pain).  Marland Kitchen ALPRAZolam (XANAX) 0.5 MG tablet Take 1 tablet by mouth at bedtime.  Marland Kitchen amoxicillin (AMOXIL) 500 MG capsule Take 4 capsules by mouth 1 hour prior to dental appointment every 4 months  . aspirin 81 MG tablet Take 81 mg by mouth daily.  . Calcium Carbonate-Vitamin D (CALTRATE 600+D) 600-400 MG-UNIT tablet Take 1 tablet by mouth 2 (two) times daily.  Marland Kitchen donepezil (ARICEPT) 10 MG tablet TAKE 1 TABLET BY MOUTH AT  BEDTIME  . memantine (NAMENDA) 10 MG tablet TAKE 1 TABLET BY MOUTH  TWICE DAILY  . metoprolol succinate (TOPROL-XL) 25 MG 24 hr tablet TAKE ONE-HALF TABLET BY  MOUTH DAILY  . Multiple Vitamins-Minerals (CENTRUM SILVER PO) Take 1 tablet by mouth daily.   . pravastatin (PRAVACHOL) 40 MG tablet TAKE 1 TABLET BY MOUTH  DAILY   No facility-administered encounter medications on file as of 06/10/2019.    Review of Systems  Constitutional: Negative for appetite change, chills, fatigue and fever.  Respiratory: Negative for cough, chest tightness, shortness of breath and wheezing.   Cardiovascular: Negative for chest pain, palpitations and leg swelling.  Gastrointestinal: Negative for abdominal distention, abdominal pain, diarrhea, nausea and vomiting.  Genitourinary: Positive for frequency. Negative for decreased urine volume, difficulty urinating, dysuria, flank pain, hematuria and urgency.  Musculoskeletal: Positive for arthralgias and back pain.       Has chronic back pain   Neurological: Negative for dizziness, light-headedness and headaches.  Psychiatric/Behavioral: Negative for agitation and confusion. The patient is not nervous/anxious.     Immunization History  Administered Date(s) Administered  .  Fluad Quad(high Dose 65+) 10/10/2018  . Influenza, High Dose Seasonal PF 12/14/2017  . Influenza-Unspecified 12/20/2012, 02/16/2015  . Pneumococcal Conjugate-13 02/01/2014  . Pneumococcal Polysaccharide-23 01/20/1994  . Tdap 05/07/2011  . Zoster Recombinat (Shingrix) 09/19/2016   Pertinent  Health Maintenance Due  Topic Date Due  . INFLUENZA VACCINE  08/21/2019  . DEXA SCAN  Completed  . PNA vac Low Risk Adult  Completed   Fall Risk  06/10/2019 05/16/2019 04/11/2019 09/14/2018 05/12/2018  Falls in the past year? 0 0 0 1 0  Number falls in past yr: 0 0 0 0 0  Injury with Fall? 0 0 0 0 0  Comment - - - - -  Risk for fall due to : - - - - -  Risk for fall due to: Comment - - - - -   There were no vitals filed for this visit. There is no height or weight on file to calculate BMI. Physical Exam  Unable to complete on telephone  visit.   Labs reviewed: Recent Labs    03/16/19 1537  NA 142  K 3.8  CL 103  CO2 31  GLUCOSE 150*  BUN 18  CREATININE 0.97*  CALCIUM 9.7   Recent Labs    03/16/19 1537  AST 18  ALT 13  BILITOT 0.3  PROT 7.0   Recent Labs    03/16/19 1537  WBC 5.9  NEUTROABS 3,605  HGB 13.9  HCT 41.8  MCV 92.7  PLT 194   Lab Results  Component Value Date   TSH 5.05 (H) 12/14/2017   No results found for: HGBA1C Lab Results  Component Value Date   CHOL 169 03/16/2019   HDL 49 (L) 03/16/2019   LDLCALC 85 03/16/2019   TRIG 264 (H) 03/16/2019   CHOLHDL 3.4 03/16/2019    Significant Diagnostic Results in last 30 days:  No results found.  Assessment/Plan  Urine frequency Granddaughter reports no fever or chills. - New care giver reports urine frequency but granddaughter verified with patient's regular care giver who states patient has had urine frequency for several years.Not new to patient.Negative for other urinary tract infections symptoms.Recommended bring patient to collect urine specimen to rule out UTI but granddaughter decline since urine  frequency is ongoing.she didn't know prior to booking appointment.   - Encourage fluid intake. - Advised to Notify provider if symptoms worsen or running any fever.  Family/ staff Communication: Reviewed plan of care with patient and Granddaughter.   Labs/tests ordered: None   Next Appointment: As needed if symptoms worsen or fail to improve.   Spent 11 minutes of non-face to face with patient.  I connected with  Julia Henson and Granddaughter on 06/10/19 by Telephone enabled telemedicine application and verified that I am speaking with the correct person using two identifiers.   I discussed the limitations of evaluation and management by telemedicine. The patient expressed understanding and agreed to proceed.  Caesar Bookman, NP

## 2019-06-30 ENCOUNTER — Telehealth: Payer: Self-pay

## 2019-06-30 DIAGNOSIS — R350 Frequency of micturition: Secondary | ICD-10-CM | POA: Diagnosis not present

## 2019-06-30 NOTE — Telephone Encounter (Signed)
Please place order  In an attempt to place order, rule out UTI was not a valid diagnosis.

## 2019-06-30 NOTE — Telephone Encounter (Signed)
Brenda lab tech stopped in the clinical area with a female who was doing her relative (Ms.Obriants caregiver) a favor.  The unknown lady dropped off a urine sample. Steward Drone states she is unable to process without an order.  Patient was last seen by Richarda Blade, NP on 06/10/2019 who recommended patient drop off a urine sample yet the granddaughter refused at the time due to no UTI symptoms and urinary frequency ongoing x several years. Message will be routed to Dinah to place order if necessary or to advise if urine sample can be disposed of.

## 2019-06-30 NOTE — Telephone Encounter (Signed)
Dx ; Urine frequency

## 2019-06-30 NOTE — Telephone Encounter (Signed)
Patient had a new care giver who had reported urine frequency but granddaughter stated urine frequency was chronic for patient.If patient has new urinary tract infection symptoms then will check urine to rule out UTI but if no symptoms then don't need to check for UTI.

## 2019-06-30 NOTE — Addendum Note (Signed)
Addended by: Meda Klinefelter E on: 06/30/2019 04:02 PM   Modules accepted: Orders

## 2019-07-02 LAB — URINE CULTURE
MICRO NUMBER:: 10580986
SPECIMEN QUALITY:: ADEQUATE

## 2019-07-04 ENCOUNTER — Other Ambulatory Visit: Payer: Self-pay

## 2019-07-04 ENCOUNTER — Encounter: Payer: Self-pay | Admitting: Family

## 2019-07-04 ENCOUNTER — Ambulatory Visit (INDEPENDENT_AMBULATORY_CARE_PROVIDER_SITE_OTHER): Payer: Medicare Other | Admitting: Family

## 2019-07-04 VITALS — BP 130/98 | HR 89 | Temp 97.1°F | Resp 16 | Ht 65.0 in | Wt 116.8 lb

## 2019-07-04 DIAGNOSIS — L853 Xerosis cutis: Secondary | ICD-10-CM

## 2019-07-04 DIAGNOSIS — R2681 Unsteadiness on feet: Secondary | ICD-10-CM

## 2019-07-04 DIAGNOSIS — R634 Abnormal weight loss: Secondary | ICD-10-CM

## 2019-07-04 DIAGNOSIS — H6121 Impacted cerumen, right ear: Secondary | ICD-10-CM

## 2019-07-04 DIAGNOSIS — W19XXXA Unspecified fall, initial encounter: Secondary | ICD-10-CM | POA: Diagnosis not present

## 2019-07-04 DIAGNOSIS — M6281 Muscle weakness (generalized): Secondary | ICD-10-CM

## 2019-07-04 LAB — COMPLETE METABOLIC PANEL WITH GFR
AG Ratio: 1.3 (calc) (ref 1.0–2.5)
ALT: 12 U/L (ref 6–29)
AST: 20 U/L (ref 10–35)
Albumin: 4 g/dL (ref 3.6–5.1)
Alkaline phosphatase (APISO): 55 U/L (ref 37–153)
BUN: 11 mg/dL (ref 7–25)
CO2: 30 mmol/L (ref 20–32)
Calcium: 9.5 mg/dL (ref 8.6–10.4)
Chloride: 101 mmol/L (ref 98–110)
Creat: 0.78 mg/dL (ref 0.60–0.88)
GFR, Est African American: 77 mL/min/{1.73_m2} (ref >=60)
GFR, Est Non African American: 66 mL/min/{1.73_m2} (ref >=60)
Globulin: 3 g/dL (calc) (ref 1.9–3.7)
Glucose, Bld: 101 mg/dL — ABNORMAL HIGH (ref 65–99)
Potassium: 3.7 mmol/L (ref 3.5–5.3)
Sodium: 141 mmol/L (ref 135–146)
Total Bilirubin: 0.4 mg/dL (ref 0.2–1.2)
Total Protein: 7 g/dL (ref 6.1–8.1)

## 2019-07-04 LAB — CBC WITH DIFFERENTIAL/PLATELET
Absolute Monocytes: 470 cells/uL (ref 200–950)
Basophils Absolute: 31 cells/uL (ref 0–200)
Basophils Relative: 0.4 %
Eosinophils Absolute: 62 cells/uL (ref 15–500)
Eosinophils Relative: 0.8 %
HCT: 39.7 % (ref 35.0–45.0)
Hemoglobin: 13.2 g/dL (ref 11.7–15.5)
Lymphs Abs: 1994 cells/uL (ref 850–3900)
MCH: 31 pg (ref 27.0–33.0)
MCHC: 33.2 g/dL (ref 32.0–36.0)
MCV: 93.2 fL (ref 80.0–100.0)
MPV: 9.3 fL (ref 7.5–12.5)
Monocytes Relative: 6.1 %
Neutro Abs: 5144 cells/uL (ref 1500–7800)
Neutrophils Relative %: 66.8 %
Platelets: 275 10*3/uL (ref 140–400)
RBC: 4.26 10*6/uL (ref 3.80–5.10)
RDW: 13.5 % (ref 11.0–15.0)
Total Lymphocyte: 25.9 %
WBC: 7.7 10*3/uL (ref 3.8–10.8)

## 2019-07-04 MED ORDER — CRANBERRY 475 MG PO CAPS: 475.0000 mg | ORAL_CAPSULE | Freq: Two times a day (BID) | ORAL | 3 refills | Status: AC

## 2019-07-04 MED ORDER — ENSURE HIGH PROTEIN PO LIQD: ORAL | 5 refills | Status: DC

## 2019-07-04 NOTE — Progress Notes (Signed)
Provider: Syaire Saber FNP-C  [name], NP  Patient Care Team: [name], NP as PCP - General (Geriatric Medicine)  Extended Emergency Contact Information Primary Emergency Contact: Tedd Sias Address: [place], [place] [address] [place] Phone: [phone] Mobile Phone: [phone] Relation: Daughter  Code Status:  DNR Goals of care: Advanced Directive information Advanced Directives 07/04/2019  Does Patient Have a Medical Advance Directive? Yes  Type of Advance Directive Out of facility DNR (pink MOST or yellow form);Healthcare Power of Attorney  Does patient want to make changes to medical advance directive? No - Patient declined  Copy of [place] in Chart? No - copy requested  Pre-existing out of facility DNR order (yellow form or pink MOST form) -     Chief Complaint  Patient presents with   Acute Visit    Complains of falling.     HPI:  Pt is a 86 y.o. female seen today for an acute visit for evaluation of unwitnessed fall on Thursday 06/30/2019.I'm seeing her for the first time usually follow up with PCP Dani Gobble.she sustained bruise on her face,nose and lip.Also had a skin tear yesterday while sitting on her recliner.Daughter states just recently discharged from hospital due to COVID-19 still recovering working with Therapy.she has private pay assistance few days to assist patient.she has had 8.2 lbs weight loss over 4 months.Daughter states patient has a good appetite.she tends to use the bathroom frequent to void though unclear if she really voids or just a habit.Care.Granddaughter present states escorted the patient to the bathroom prior to visit and noted round small stool wonders whether patient is constipated.    Past Medical History:  Diagnosis Date   Brittle nails    CAD (coronary artery disease)    mild by cath in 2010   Cellulitis    Cellulitis of left  anterior lower leg    Chronic kidney disease, stage II (mild)    Dementia with behavioral disturbance (HCC)    Depression with anxiety 05/25/2007   Dyslipidemia    GERD (gastroesophageal reflux disease)    Hallucinations    Head ache    Hordeolum externum of right eye    Hypertension    Leg ulcer, left, limited to breakdown of skin (HCC)    Mitral insufficiency    MVP (mitral valve prolapse)    Occult blood in stools    Open wound of lower leg, right, sequela    Open wound of lower leg, right, subsequent encounter    stage 3 ulcer   Osteopenia    Skin lesion of hand    Urinary frequency    Urticaria    Vertigo    Past Surgical History:  Procedure Laterality Date   APPENDECTOMY     CARDIAC CATHETERIZATION  06/16/2008   normal LV systolic function, MVP with some mild MR, prox LAD disease (50%) - Dr. Rockne Menghini   CARDIAC CATHETERIZATION  10/16/2005   normal LV systolic function, MVP with trace to 1+ MR, mild nonobstructive CAD involving LAD 30% prox, 10-20 narrowing in prox LAD (Dr. Corky Downs)   CATARACT EXTRACTION, BILATERAL     CHOLECYSTECTOMY     NM MYOCAR PERF WALL MOTION  02/2011   lexiscan myoview; perfusion dfect of 5% of LV myocardium, small area of anteroseptal breast attenuation artifact, no reversible ischemia, EF 81%, abnormal but low risk scan   TONSILLECTOMY  Allergies  Allergen Reactions   Corn-Containing Products    Omeprazole Other (See Comments)   Other Hives    Grapes and banana concentrate   Phenobarbital Rash    rash   Sulfa Antibiotics Rash    Outpatient Encounter Medications as of 07/04/2019  Medication Sig   acetaminophen (TYLENOL) 325 MG tablet Take 325 mg by mouth as needed (back pain).   ALPRAZolam (XANAX) 0.5 MG tablet Take 1 tablet by mouth at bedtime.   amoxicillin (AMOXIL) 500 MG capsule Take 4 capsules by mouth 1 hour prior to dental appointment every 4 months   aspirin 81 MG tablet Take 81 mg by mouth  daily.   Calcium Carbonate-Vitamin D (CALTRATE 600+D) 600-400 MG-UNIT tablet Take 1 tablet by mouth 2 (two) times daily.   donepezil (ARICEPT) 10 MG tablet TAKE 1 TABLET BY MOUTH AT  BEDTIME   memantine (NAMENDA) 10 MG tablet TAKE 1 TABLET BY MOUTH  TWICE DAILY   metoprolol succinate (TOPROL-XL) 25 MG 24 hr tablet TAKE ONE-HALF TABLET BY  MOUTH DAILY   Multiple Vitamins-Minerals (CENTRUM SILVER PO) Take 1 tablet by mouth daily.    pravastatin (PRAVACHOL) 40 MG tablet TAKE 1 TABLET BY MOUTH  DAILY   No facility-administered encounter medications on file as of 07/04/2019.    Review of Systems  Constitutional: Positive for unexpected weight change. Negative for appetite change, chills, fatigue and fever.       Has weight loss 8.2 lbs   HENT: Positive for hearing loss. Negative for congestion, rhinorrhea, sinus pressure, sinus pain, sneezing, sore throat and trouble swallowing.   Eyes: Negative for discharge, redness and itching.  Respiratory: Negative for cough, chest tightness, shortness of breath and wheezing.   Cardiovascular: Positive for leg swelling. Negative for chest pain and palpitations.  Gastrointestinal: Negative for abdominal distention, abdominal pain, diarrhea, nausea and vomiting.    Immunization History  Administered Date(s) Administered   Fluad Quad(high Dose 65+) 10/10/2018   Influenza, High Dose Seasonal PF 12/14/2017   Influenza-Unspecified 12/20/2012, 02/16/2015   Pneumococcal Conjugate-13 02/01/2014   Pneumococcal Polysaccharide-23 01/20/1994   Tdap 05/07/2011   Zoster Recombinat (Shingrix) 09/19/2016   Pertinent  Health Maintenance Due  Topic Date Due   INFLUENZA VACCINE  08/21/2019   DEXA SCAN  Completed   PNA vac Low Risk Adult  Completed   Fall Risk  07/04/2019 06/10/2019 05/16/2019 04/11/2019 09/14/2018  Falls in the past year? 1 0 0 0 1  Number falls in past yr: 1 0 0 0 0  Injury with Fall? 1 0 0 0 0  Comment - - - - -  Risk for fall due  to : - - - - -  Risk for fall due to: Comment - - - - -    Vitals:   07/04/19 1108  BP: (!) 130/98  Pulse: 89  Resp: 16  Temp: (!) 97.1 F (36.2 C)  SpO2: 97%  Weight: 116 lb 12.8 oz (53 kg)  Height: _0  (1.651 m)   Body mass index is 19.44 kg/m. Physical Exam Vitals reviewed.  Constitutional:      General: She is not in acute distress.    Appearance: She is not ill-appearing.  HENT:     Head: Normocephalic.     Right Ear: There is impacted cerumen.     Left Ear: Tympanic membrane, ear canal and external ear normal. There is no impacted cerumen.     Ears:     Comments: Right ear cerumen lavaged  with warm water and hydrogen peroxide moderate amounts of cerumen obtain.Large amounts removed using alligator forceps and small amount with curette.    Nose: Nose normal. No congestion or rhinorrhea.     Mouth/Throat:     Mouth: Mucous membranes are moist.     Pharynx: Oropharynx is clear. No oropharyngeal exudate or posterior oropharyngeal erythema.  Eyes:     General: No scleral icterus.       Right eye: No discharge.        Left eye: No discharge.     Extraocular Movements: Extraocular movements intact.     Conjunctiva/sclera: Conjunctivae normal.     Pupils: Pupils are equal, round, and reactive to light.  Cardiovascular:     Rate and Rhythm: Normal rate and regular rhythm.     Pulses: Normal pulses.     Heart sounds: Normal heart sounds. No murmur heard.  No friction rub. No gallop.   Pulmonary:     Effort: Pulmonary effort is normal. No respiratory distress.     Breath sounds: Normal breath sounds. No wheezing, rhonchi or rales.  Chest:     Chest wall: No tenderness.  Abdominal:     General: Bowel sounds are normal. There is no distension.     Palpations: Abdomen is soft. There is no mass.     Tenderness: There is no abdominal tenderness. There is no right CVA tenderness, left CVA tenderness, guarding or rebound.  Musculoskeletal:        General: No swelling or  tenderness.     Right lower leg: Edema present.     Left lower leg: Edema present.     Comments: Unsteady gait.bilateral lower extremities trace edema.   Skin:    General: Skin is warm and dry.     Coloration: Skin is not pale.     Findings: No erythema.     Comments: Dry flaky skin   Neurological:     Mental Status: She is alert. Mental status is at baseline.     Gait: Gait abnormal.     Comments: Heard of hearing   Psychiatric:        Mood and Affect: Mood normal.        Behavior: Behavior normal.        Thought Content: Thought content normal.        Judgment: Judgment normal.    Labs reviewed: Recent Labs    03/16/19 1537  NA 142  K 3.8  CL 103  CO2 31  GLUCOSE 150*  BUN 18  CREATININE 0.97*  CALCIUM 9.7   Recent Labs    03/16/19 1537  AST 18  ALT 13  BILITOT 0.3  PROT 7.0   Recent Labs    03/16/19 1537  WBC 5.9  NEUTROABS 3,605  HGB 13.9  HCT 41.8  MCV 92.7  PLT 194   Lab Results  Component Value Date   TSH 5.05 (H) 12/14/2017   No results found for: HGBA1C Lab Results  Component Value Date   CHOL 169 03/16/2019   HDL 49 (L) 03/16/2019   LDLCALC 85 03/16/2019   TRIG 264 (H) 03/16/2019   CHOLHDL 3.4 03/16/2019    Significant Diagnostic Results in last 30 days:  No results found.  Assessment/Plan 1. Fall, initial encounter Unwitnessed fall.sustained bruises on the nose,right and upper lip.also had right elbow skin tear getting out of her recliner.I've discussed with daughter around the clock or options for placement in the assisted living.  - CBC  with Differential/Platelet - Ambulatory referral to [facility] for ROM,gait stability,muscle strengthening and   2. Unsteady gait Uses Front wheel walker but forgets walker most of the time. - Ambulatory referral to [facility] for gait stability.   3. Abnormal weight loss Has had 8 lbs weight gain over 2 months. - CBC with Differential/Platelet - CMP with  eGFR([facility]) - Ambulatory referral to [facility] as above. - Advised to drink protein supplement Ensure one can by mouth daily  - follow up in one month for weight check.  4. Dry skin dermatitis Generalized dry flaky skin worst on lower extremities. - Increase water intake to at least 6-8 glasses of water daily  - Drink protein supplement Ensure one can by mouth daily  - Apply Aquaphor ointment to both legs twice daily for dry skin   5. Generalized muscle weakness Has had generalized weakness and fall episiodes. - Ambulatory referral to [facility] for muscle strengthening.  6. Impacted cerumen of right ear Right ear cerumen lavaged with warm water and hydrogen peroxide moderate amounts of cerumen obtain.Large amounts removed using alligator forceps and small amount with curette.she tolerated procedure well.   Family/ staff Communication: Reviewed plan of care with patient,daughter and granddaughter verbalized understanding.   Labs/tests ordered:  - CBC with Differential/Platelet - CMP with eGFR([facility]) Next Appointment: one month for weight check.   Sandrea Hughs, NP

## 2019-07-04 NOTE — Patient Instructions (Addendum)
-   cleanse right elbow skin tear with saline,pat dry,apply triple antibiotic ointment and cover with foam dressing as directed or gauze.change dressing every three days and as needed if dressing is soiled until healed. - Notify provider if running any fever,chillls,drainage,swelling or redness on right elbow skin tear. - Drink protein supplement Ensure one can by mouth daily  - Apply Aquaphor ointment to both legs twice daily for dry skin  - Increase water intake to at least 6-8 glasses of water daily  - Take cranberry tablet one by mouth twice daily to prevent urinary tract infection

## 2019-07-12 DIAGNOSIS — N182 Chronic kidney disease, stage 2 (mild): Secondary | ICD-10-CM

## 2019-07-12 DIAGNOSIS — F0391 Unspecified dementia with behavioral disturbance: Secondary | ICD-10-CM | POA: Diagnosis not present

## 2019-07-12 DIAGNOSIS — I129 Hypertensive chronic kidney disease with stage 1 through stage 4 chronic kidney disease, or unspecified chronic kidney disease: Secondary | ICD-10-CM | POA: Diagnosis not present

## 2019-07-12 DIAGNOSIS — F418 Other specified anxiety disorders: Secondary | ICD-10-CM

## 2019-07-12 DIAGNOSIS — E785 Hyperlipidemia, unspecified: Secondary | ICD-10-CM

## 2019-07-12 DIAGNOSIS — I051 Rheumatic mitral insufficiency: Secondary | ICD-10-CM

## 2019-07-12 DIAGNOSIS — M8580 Other specified disorders of bone density and structure, unspecified site: Secondary | ICD-10-CM | POA: Diagnosis not present

## 2019-07-12 DIAGNOSIS — I2510 Atherosclerotic heart disease of native coronary artery without angina pectoris: Secondary | ICD-10-CM | POA: Diagnosis not present

## 2019-07-19 DIAGNOSIS — I129 Hypertensive chronic kidney disease with stage 1 through stage 4 chronic kidney disease, or unspecified chronic kidney disease: Secondary | ICD-10-CM | POA: Diagnosis not present

## 2019-07-19 DIAGNOSIS — N182 Chronic kidney disease, stage 2 (mild): Secondary | ICD-10-CM | POA: Diagnosis not present

## 2019-07-19 DIAGNOSIS — M8580 Other specified disorders of bone density and structure, unspecified site: Secondary | ICD-10-CM | POA: Diagnosis not present

## 2019-07-19 DIAGNOSIS — I2510 Atherosclerotic heart disease of native coronary artery without angina pectoris: Secondary | ICD-10-CM | POA: Diagnosis not present

## 2019-07-26 DIAGNOSIS — I129 Hypertensive chronic kidney disease with stage 1 through stage 4 chronic kidney disease, or unspecified chronic kidney disease: Secondary | ICD-10-CM | POA: Diagnosis not present

## 2019-07-26 DIAGNOSIS — N182 Chronic kidney disease, stage 2 (mild): Secondary | ICD-10-CM | POA: Diagnosis not present

## 2019-07-26 DIAGNOSIS — I2510 Atherosclerotic heart disease of native coronary artery without angina pectoris: Secondary | ICD-10-CM | POA: Diagnosis not present

## 2019-07-26 DIAGNOSIS — M8580 Other specified disorders of bone density and structure, unspecified site: Secondary | ICD-10-CM | POA: Diagnosis not present

## 2019-08-01 DIAGNOSIS — I129 Hypertensive chronic kidney disease with stage 1 through stage 4 chronic kidney disease, or unspecified chronic kidney disease: Secondary | ICD-10-CM | POA: Diagnosis not present

## 2019-08-01 DIAGNOSIS — M8580 Other specified disorders of bone density and structure, unspecified site: Secondary | ICD-10-CM | POA: Diagnosis not present

## 2019-08-01 DIAGNOSIS — I2510 Atherosclerotic heart disease of native coronary artery without angina pectoris: Secondary | ICD-10-CM | POA: Diagnosis not present

## 2019-08-01 DIAGNOSIS — N182 Chronic kidney disease, stage 2 (mild): Secondary | ICD-10-CM | POA: Diagnosis not present

## 2019-08-05 ENCOUNTER — Ambulatory Visit: Payer: Medicare Other | Admitting: Family

## 2019-08-05 ENCOUNTER — Telehealth: Payer: Self-pay | Admitting: *Deleted

## 2019-08-05 DIAGNOSIS — G4700 Insomnia, unspecified: Secondary | ICD-10-CM

## 2019-08-05 MED ORDER — TRAZODONE HCL 50 MG PO TABS: 25.0000 mg | ORAL_TABLET | Freq: Every evening | ORAL | 3 refills | Status: DC | PRN

## 2019-08-05 NOTE — Telephone Encounter (Signed)
May send Trazodone 50 mg tablet take 1/2 tablet by mouth at bedtime as needed for insomnia.# 15 with 3 refill

## 2019-08-05 NOTE — Telephone Encounter (Signed)
Julia Henson, Caregiver called and stated that she has been giving Trazodone as needed to help patient rest. Stated that Julia Henson Prescribed Trazodone 50mg  1/2 tablet at bedtime.  Medication IS NOT in Current medication list. Caregiver has been giving to patient and used the last one last night.   Needs refill sent to [facility]. Is this ok to add back to medication list and refill.  Please Advise.

## 2019-08-12 ENCOUNTER — Ambulatory Visit: Payer: Medicare Other | Admitting: Family

## 2019-08-27 ENCOUNTER — Other Ambulatory Visit: Payer: Self-pay | Admitting: Family

## 2019-08-27 DIAGNOSIS — G4700 Insomnia, unspecified: Secondary | ICD-10-CM

## 2019-08-29 NOTE — Telephone Encounter (Signed)
Received refill request from pharmacy. Patient is requesting a 90 days supply.  Pended Rx and sent to [facility] for approval due to HIGH ALERT Warning.

## 2019-09-12 ENCOUNTER — Ambulatory Visit: Payer: Medicare Other | Admitting: Nurse Practitioner

## 2019-09-14 ENCOUNTER — Other Ambulatory Visit: Payer: Self-pay

## 2019-09-14 ENCOUNTER — Ambulatory Visit (INDEPENDENT_AMBULATORY_CARE_PROVIDER_SITE_OTHER): Payer: Medicare Other | Admitting: Nurse Practitioner

## 2019-09-14 ENCOUNTER — Encounter: Payer: Self-pay | Admitting: Nurse Practitioner

## 2019-09-14 VITALS — BP 150/89 | HR 65 | Temp 96.8°F | Resp 20 | Ht 65.0 in | Wt 118.0 lb

## 2019-09-14 DIAGNOSIS — F0391 Unspecified dementia with behavioral disturbance: Secondary | ICD-10-CM

## 2019-09-14 DIAGNOSIS — G4700 Insomnia, unspecified: Secondary | ICD-10-CM | POA: Diagnosis not present

## 2019-09-14 DIAGNOSIS — I10 Essential (primary) hypertension: Secondary | ICD-10-CM | POA: Diagnosis not present

## 2019-09-14 DIAGNOSIS — M8580 Other specified disorders of bone density and structure, unspecified site: Secondary | ICD-10-CM

## 2019-09-14 DIAGNOSIS — E785 Hyperlipidemia, unspecified: Secondary | ICD-10-CM

## 2019-09-14 NOTE — Progress Notes (Signed)
Careteam: Patient Care Team: Sharon Seller, NP as PCP - General (Geriatric Medicine)  PLACE OF SERVICE:  [facility]  Advanced Directive information Does Patient Have a Medical Advance Directive?: Yes, Type of Advance Directive: Out of facility DNR (pink MOST or yellow form), Pre-existing out of facility DNR order (yellow form or pink MOST form): Yellow form placed in chart (order not valid for inpatient use);Pink MOST form placed in chart (order not valid for inpatient use), Does patient want to make changes to medical advance directive?: No - Patient declined  Allergies  Allergen Reactions   Corn-Containing Products    Omeprazole Other (See Comments)   Other Hives    Grapes and banana concentrate   Phenobarbital Rash    rash   Sulfa Antibiotics Rash    Chief Complaint  Patient presents with   Medical Management of Chronic Issues    6 Month Follow Up/ Patient says she was positive for Covid in April     HPI: Patient is a 86 y.o. female for routine follow up.   Dementia- continues to live with daughter, day center reopened so she is going to that which she enjoyed.  Some agitation, started this week.  Does not like to shower.  Daughter works with her but she does not like to have her daughter help. Continues on aricept and namenda.   Has had first COVID vaccine.   Insomnia- getting up to go to the bathroom a lot (overactive bladder) drinking a lot of water but then getting up a lot at night.   Had a fall and daughter now has her using walker- went PT .   Review of Systems:  Review of Systems  Unable to perform ROS: Dementia    Past Medical History:  Diagnosis Date   Brittle nails    CAD (coronary artery disease)    mild by cath in 2010   Cellulitis    Cellulitis of left anterior lower leg    Chronic kidney disease, stage II (mild)    Dementia with behavioral disturbance (HCC)    Depression with anxiety 05/25/2007   Dyslipidemia    GERD  (gastroesophageal reflux disease)    Hallucinations    Head ache    Hordeolum externum of right eye    Hypertension    Leg ulcer, left, limited to breakdown of skin (HCC)    Mitral insufficiency    MVP (mitral valve prolapse)    Occult blood in stools    Open wound of lower leg, right, sequela    Open wound of lower leg, right, subsequent encounter    stage 3 ulcer   Osteopenia    Skin lesion of hand    Urinary frequency    Urticaria    Vertigo    Past Surgical History:  Procedure Laterality Date   APPENDECTOMY     CARDIAC CATHETERIZATION  06/16/2008   normal LV systolic function, MVP with some mild MR, prox LAD disease (50%) - Dr. Langston Reusing   CARDIAC CATHETERIZATION  10/16/2005   normal LV systolic function, MVP with trace to 1+ MR, mild nonobstructive CAD involving LAD 30% prox, 10-20 narrowing in prox LAD (Dr. Bishop Limbo)   CATARACT EXTRACTION, BILATERAL     CHOLECYSTECTOMY     NM MYOCAR PERF WALL MOTION  02/2011   lexiscan myoview; perfusion dfect of 5% of LV myocardium, small area of anteroseptal breast attenuation artifact, no reversible ischemia, EF 81%, abnormal but low risk scan  TONSILLECTOMY     Social History:   reports that she has never smoked. She has never used smokeless tobacco. She reports that she does not drink alcohol and does not use drugs.  Family History  Problem Relation Age of Onset   Heart attack Mother 54   Pneumonia Father 40   Alzheimer's disease Father    Diabetes Sister 70   Heart Problems Brother 76   Dementia Sister 75   Pneumonia Sister        34 weeks old   Dementia Sister 43   Alzheimer's disease Sister 46    Medications: Patient's Medications  New Prescriptions   No medications on file  Previous Medications   ACETAMINOPHEN (TYLENOL) 325 MG TABLET    Take 325 mg by mouth as needed (back pain).   ALPRAZOLAM (XANAX) 0.5 MG TABLET    Take 1 tablet by mouth at bedtime.   AMOXICILLIN (AMOXIL) 500 MG  CAPSULE    Take 4 capsules by mouth 1 hour prior to dental appointment every 4 months   ASPIRIN 81 MG TABLET    Take 81 mg by mouth daily.   CALCIUM CARBONATE-VITAMIN D (CALTRATE 600+D) 600-400 MG-UNIT TABLET    Take 1 tablet by mouth 2 (two) times daily.   DONEPEZIL (ARICEPT) 10 MG TABLET    TAKE 1 TABLET BY MOUTH AT  BEDTIME   MEMANTINE (NAMENDA) 10 MG TABLET    TAKE 1 TABLET BY MOUTH  TWICE DAILY   METOPROLOL SUCCINATE (TOPROL-XL) 25 MG 24 HR TABLET    TAKE ONE-HALF TABLET BY  MOUTH DAILY   MULTIPLE VITAMINS-MINERALS (CENTRUM SILVER PO)    Take 1 tablet by mouth daily.    NUTRITIONAL SUPPLEMENTS (ENSURE HIGH PROTEIN) LIQD    Drink one can by mouth daily   PRAVASTATIN (PRAVACHOL) 40 MG TABLET    TAKE 1 TABLET BY MOUTH  DAILY   TRAZODONE (DESYREL) 50 MG TABLET    TAKE 0.5-1 TABLETS (25-50 MG TOTAL) BY MOUTH AT BEDTIME AS NEEDED FOR SLEEP.  Modified Medications   No medications on file  Discontinued Medications   No medications on file    Physical Exam:  Vitals:   09/14/19 1245  BP: (!) 150/89  Pulse: 65  Resp: 20  Temp: (!) 96.8 F (36 C)  TempSrc: Oral  SpO2: 95%  Weight: 118 lb (53.5 kg)  Height: 5\' 5"  (1.651 m)   Body mass index is 19.64 kg/m. Wt Readings from Last 3 Encounters:  09/14/19 118 lb (53.5 kg)  07/04/19 116 lb 12.8 oz (53 kg)  03/16/19 124 lb 9.6 oz (56.5 kg)    Physical Exam Constitutional:      General: She is not in acute distress.    Appearance: She is well-developed. She is not diaphoretic.  HENT:     Head: Normocephalic and atraumatic.     Mouth/Throat:     Pharynx: No oropharyngeal exudate.  Eyes:     Conjunctiva/sclera: Conjunctivae normal.     Pupils: Pupils are equal, round, and reactive to light.  Cardiovascular:     Rate and Rhythm: Normal rate and regular rhythm.     Heart sounds: Normal heart sounds.  Pulmonary:     Effort: Pulmonary effort is normal.     Breath sounds: Normal breath sounds.  Abdominal:     General: Bowel sounds  are normal.     Palpations: Abdomen is soft.  Musculoskeletal:        General: No tenderness.  Cervical back: Normal range of motion and neck supple.  Skin:    General: Skin is warm and dry.  Neurological:     Mental Status: She is alert. Mental status is at baseline.    Labs reviewed: Basic Metabolic Panel: Recent Labs    03/16/19 1537 07/04/19 1241  NA 142 141  K 3.8 3.7  CL 103 101  CO2 31 30  GLUCOSE 150* 101*  BUN 18 11  CREATININE 0.97* 0.78  CALCIUM 9.7 9.5   Liver Function Tests: Recent Labs    03/16/19 1537 07/04/19 1241  AST 18 20  ALT 13 12  BILITOT 0.3 0.4  PROT 7.0 7.0   No results for input(s): LIPASE, AMYLASE in the last 8760 hours. No results for input(s): AMMONIA in the last 8760 hours. CBC: Recent Labs    03/16/19 1537 07/04/19 1241  WBC 5.9 7.7  NEUTROABS 3,605 5,144  HGB 13.9 13.2  HCT 41.8 39.7  MCV 92.7 93.2  PLT 194 275   Lipid Panel: Recent Labs    03/16/19 1537  CHOL 169  HDL 49*  LDLCALC 85  TRIG 570*  CHOLHDL 3.4   TSH: No results for input(s): TSH in the last 8760 hours. A1C: No results found for: HGBA1C   Assessment/Plan 1. Insomnia, unspecified type Stable at this time.   2. Dementia with behavioral disturbance, unspecified dementia type (HCC) Progressive decline, going back to day center which has been beneficial. Lives with daughter. Continues on aricept and namenda.   3. Osteopenia, unspecified location -continues on vit d and cal with weight bearing exercise.   4. Dyslipidemia LDL at goal on pravastatin.  5. Essential hypertension Improved on recheck, continues on metoprolol succinate 12.5 mg daily   Next appt: 6 months.  Janene Harvey. Biagio Borg  [facility] [phone]

## 2019-11-15 ENCOUNTER — Other Ambulatory Visit: Payer: Self-pay | Admitting: Nurse Practitioner

## 2019-11-15 DIAGNOSIS — F0391 Unspecified dementia with behavioral disturbance: Secondary | ICD-10-CM

## 2019-11-16 NOTE — Telephone Encounter (Signed)
Pharmacy requested refills.  Pended Rx's and sent to [facility] for approval due to HIGH ALERT Warning.  

## 2020-02-26 ENCOUNTER — Other Ambulatory Visit: Payer: Self-pay | Admitting: Nurse Practitioner

## 2020-02-26 DIAGNOSIS — G4700 Insomnia, unspecified: Secondary | ICD-10-CM

## 2020-02-27 NOTE — Telephone Encounter (Signed)
High risk or very high risk warning populated when attempting to refill medication. RX request sent to PCP for review and approval if warranted.   

## 2020-03-19 ENCOUNTER — Ambulatory Visit (INDEPENDENT_AMBULATORY_CARE_PROVIDER_SITE_OTHER): Payer: Medicare Other | Admitting: Nurse Practitioner

## 2020-03-19 ENCOUNTER — Encounter: Payer: Self-pay | Admitting: Nurse Practitioner

## 2020-03-19 ENCOUNTER — Other Ambulatory Visit: Payer: Self-pay

## 2020-03-19 VITALS — BP 128/80 | HR 87 | Temp 97.6°F | Ht 65.0 in | Wt 110.0 lb

## 2020-03-19 DIAGNOSIS — I10 Essential (primary) hypertension: Secondary | ICD-10-CM | POA: Diagnosis not present

## 2020-03-19 DIAGNOSIS — E785 Hyperlipidemia, unspecified: Secondary | ICD-10-CM

## 2020-03-19 DIAGNOSIS — M8580 Other specified disorders of bone density and structure, unspecified site: Secondary | ICD-10-CM | POA: Diagnosis not present

## 2020-03-19 DIAGNOSIS — I2583 Coronary atherosclerosis due to lipid rich plaque: Secondary | ICD-10-CM

## 2020-03-19 DIAGNOSIS — F0391 Unspecified dementia with behavioral disturbance: Secondary | ICD-10-CM

## 2020-03-19 DIAGNOSIS — D692 Other nonthrombocytopenic purpura: Secondary | ICD-10-CM

## 2020-03-19 DIAGNOSIS — R636 Underweight: Secondary | ICD-10-CM

## 2020-03-19 DIAGNOSIS — G4700 Insomnia, unspecified: Secondary | ICD-10-CM | POA: Diagnosis not present

## 2020-03-19 DIAGNOSIS — I2510 Atherosclerotic heart disease of native coronary artery without angina pectoris: Secondary | ICD-10-CM

## 2020-03-19 NOTE — Progress Notes (Signed)
Careteam: Patient Care Team: [name], NP as PCP - General (Geriatric Medicine)  PLACE OF SERVICE:  [facility] Directive information Does Patient Have a Medical Advance Directive?: Yes, Type of Advance Directive: Out of facility DNR (pink MOST or yellow form), Pre-existing out of facility DNR order (yellow form or pink MOST form): Yellow form placed in chart (order not valid for inpatient use);Pink MOST form placed in chart (order not valid for inpatient use), Does patient want to make changes to medical advance directive?: No - Patient declined  Allergies  Allergen Reactions  . Corn-Containing Products   . Omeprazole Other (See Comments)  . Other Hives    Grapes and banana concentrate  . Phenobarbital Rash    rash  . Sulfa Antibiotics Rash    Chief Complaint  Patient presents with  . Medical Management of Chronic Issues    6 month follow-up and discuss covid booster. Discuss Ensure, patient is not fond of drinking.      HPI: Patient is a 87 y.o. female for routine follow up Lives with daughter who is here with her at visit today.  Going back to the senior center daily. Very involve in this daily.   Osteopenia- ordered bone density in April. Continues on cal and vit d  Dementia- continues on aricept and namenda.   Reports she is eating well. Eating a lot of sweets. Crackers with pb, cookies, cakes.   Hyperlipidemia- on Pravachol    Insomnia- on trazodone qhs  htn- controlled on metoprolol  Review of Systems:  Review of Systems  Constitutional: Negative for chills, fever and weight loss.  HENT: Negative for tinnitus.   Respiratory: Negative for cough, sputum production and shortness of breath.   Cardiovascular: Negative for chest pain, palpitations and leg swelling.  Gastrointestinal: Negative for abdominal pain, constipation, diarrhea and heartburn.  Genitourinary: Negative for dysuria, frequency and urgency.  Musculoskeletal: Negative for  back pain, falls, joint pain and myalgias.  Skin: Negative.   Neurological: Negative for dizziness and headaches.  Psychiatric/Behavioral: Positive for memory loss. Negative for depression. The patient does not have insomnia.     Past Medical History:  Diagnosis Date  . Brittle nails   . CAD (coronary artery disease)    mild by cath in 2010  . Cellulitis   . Cellulitis of left anterior lower leg   . Chronic kidney disease, stage II (mild)   . Dementia with behavioral disturbance ([place])   . Depression with anxiety 05/25/2007  . Dyslipidemia   . GERD (gastroesophageal reflux disease)   . Hallucinations   . Head ache   . Hordeolum externum of right eye   . Hypertension   . Leg ulcer, left, limited to breakdown of skin ([place])   . Mitral insufficiency   . MVP (mitral valve prolapse)   . Occult blood in stools   . Open wound of lower leg, right, sequela   . Open wound of lower leg, right, subsequent encounter    stage 3 ulcer  . Osteopenia   . Skin lesion of hand   . Urinary frequency   . Urticaria   . Vertigo    Past Surgical History:  Procedure Laterality Date  . APPENDECTOMY    . CARDIAC CATHETERIZATION  06/16/2008   normal LV systolic function, MVP with some mild MR, prox LAD disease (50%) - Dr. Loni Muse. [place]  . CARDIAC CATHETERIZATION  10/16/2005   normal LV systolic function, MVP with trace to 1+  MR, mild nonobstructive CAD involving LAD 30% prox, 10-20 narrowing in prox LAD (Dr. Corky Downs)  . CATARACT EXTRACTION, BILATERAL    . CHOLECYSTECTOMY    . NM MYOCAR PERF WALL MOTION  02/2011   lexiscan myoview; perfusion dfect of 5% of LV myocardium, small area of anteroseptal breast attenuation artifact, no reversible ischemia, EF 81%, abnormal but low risk scan  . TONSILLECTOMY     Social History:   reports that she has never smoked. She has never used smokeless tobacco. She reports that she does not drink alcohol and does not use drugs.  Family History  Problem Relation Age of  Onset  . Heart attack Mother 71  . Pneumonia Father 38  . Alzheimer's disease Father   . Diabetes Sister 90  . Heart Problems Brother 87  . Dementia Sister 62  . Pneumonia Sister        5 weeks old  . Dementia Sister 55  . Alzheimer's disease Sister 4    Medications: Patient's Medications  New Prescriptions   No medications on file  Previous Medications   ACETAMINOPHEN (TYLENOL) 325 MG TABLET    Take 325 mg by mouth as needed (back pain).   ALPRAZOLAM (XANAX) 0.5 MG TABLET    Take 1 tablet by mouth at bedtime.   AMOXICILLIN (AMOXIL) 500 MG CAPSULE    Take 4 capsules by mouth 1 hour prior to dental appointment every 4 months   ASPIRIN 81 MG TABLET    Take 81 mg by mouth daily.   CALCIUM CARBONATE-VITAMIN D 600-400 MG-UNIT TABLET    Take 1 tablet by mouth 2 (two) times daily.   DONEPEZIL (ARICEPT) 10 MG TABLET    TAKE 1 TABLET BY MOUTH AT  BEDTIME   MEMANTINE (NAMENDA) 10 MG TABLET    TAKE 1 TABLET BY MOUTH  TWICE DAILY   METOPROLOL SUCCINATE (TOPROL-XL) 25 MG 24 HR TABLET    TAKE ONE-HALF TABLET BY  MOUTH DAILY   MULTIPLE VITAMINS-MINERALS (CENTRUM SILVER PO)    Take 1 tablet by mouth daily.    NUTRITIONAL SUPPLEMENTS (ENSURE HIGH PROTEIN) LIQD    Drink one can by mouth daily   PRAVASTATIN (PRAVACHOL) 40 MG TABLET    TAKE 1 TABLET BY MOUTH  DAILY   TRAZODONE (DESYREL) 50 MG TABLET    TAKE 0.5-1 TABLETS (25-50 MG TOTAL) BY MOUTH AT BEDTIME AS NEEDED FOR SLEEP.  Modified Medications   No medications on file  Discontinued Medications   No medications on file    Physical Exam:  Vitals:   03/19/20 1507  BP: 128/80  Pulse: 87  Temp: 97.6 F (36.4 C)  SpO2: 97%  Weight: 110 lb (49.9 kg)  Height: 5' 5"  (1.651 m)   Body mass index is 18.3 kg/m. Wt Readings from Last 3 Encounters:  03/19/20 110 lb (49.9 kg)  09/14/19 118 lb (53.5 kg)  07/04/19 116 lb 12.8 oz (53 kg)    Physical Exam Constitutional:      General: She is not in acute distress.    Appearance: She is  well-developed and well-nourished. She is not diaphoretic.  HENT:     Head: Normocephalic and atraumatic.     Mouth/Throat:     Mouth: Oropharynx is clear and moist.     Pharynx: No oropharyngeal exudate.  Eyes:     Conjunctiva/sclera: Conjunctivae normal.     Pupils: Pupils are equal, round, and reactive to light.  Cardiovascular:     Rate and Rhythm: Normal  rate and regular rhythm.     Heart sounds: Normal heart sounds.  Pulmonary:     Effort: Pulmonary effort is normal.     Breath sounds: Normal breath sounds.  Abdominal:     General: Bowel sounds are normal.     Palpations: Abdomen is soft.  Musculoskeletal:        General: No tenderness or edema.     Cervical back: Normal range of motion and neck supple.  Skin:    General: Skin is warm and dry.  Neurological:     Mental Status: She is alert. Mental status is at baseline.  Psychiatric:        Mood and Affect: Mood and affect normal.    Labs reviewed: Basic Metabolic Panel: Recent Labs    07/04/19 1241  NA 141  K 3.7  CL 101  CO2 30  GLUCOSE 101*  BUN 11  CREATININE 0.78  CALCIUM 9.5   Liver Function Tests: Recent Labs    07/04/19 1241  AST 20  ALT 12  BILITOT 0.4  PROT 7.0   No results for input(s): LIPASE, AMYLASE in the last 8760 hours. No results for input(s): AMMONIA in the last 8760 hours. CBC: Recent Labs    07/04/19 1241  WBC 7.7  NEUTROABS 5,144  HGB 13.2  HCT 39.7  MCV 93.2  PLT 275   Lipid Panel: No results for input(s): CHOL, HDL, LDLCALC, TRIG, CHOLHDL, LDLDIRECT in the last 8760 hours. TSH: No results for input(s): TSH in the last 8760 hours. A1C: No results found for: HGBA1C   Assessment/Plan 1. Dementia with behavioral disturbance, unspecified dementia type ([employer]) Stable at this time. No acute changes in cognitive or functional status. Now going to day program again. Continues on aricept and namenda.   2. Osteopenia, unspecified location -continues on cal and vit d, due  for bone density, number given to schedule.   3. Essential hypertension Controlled on metoprolol 12.5 mg daily - CMP with eGFR([facility]) - CBC with Differential/Platelet  4. Insomnia, unspecified type -well controlled on trazodone PRN. Overall sleeping well at this time.   5. Dyslipidemia -continues on pravastatin  - Lipid Panel - CMP with eGFR([facility])  6. Underweight -has lost weight, she does not perfer milk based ensures. Eating a lot of sugar, encourage more nutritious options.  Also can try recourse for nutritional supplement.  - TSH  7. Senile purpura ([place]) Noted.   8. Coronary artery disease due to lipid rich plaque Stable without chest pains. Continues on metoprolol and ASA.   Next appt: 6 months.  Carlos American. [place], [facility] [phone]

## 2020-03-19 NOTE — Patient Instructions (Addendum)
Bone density- call [phone] to schedule  teepa snow - you tube- tips and tricks for people with dementia

## 2020-03-20 LAB — LIPID PANEL
Cholesterol: 178 mg/dL (ref <200)
HDL: 53 mg/dL (ref >=50)
LDL Cholesterol (Calc): 99 mg/dL (calc)
Non-HDL Cholesterol (Calc): 125 mg/dL (calc) (ref <130)
Total CHOL/HDL Ratio: 3.4 (calc) (ref <5.0)
Triglycerides: 154 mg/dL — ABNORMAL HIGH (ref <150)

## 2020-03-20 LAB — COMPLETE METABOLIC PANEL WITH GFR
AG Ratio: 1.3 (calc) (ref 1.0–2.5)
ALT: 19 U/L (ref 6–29)
AST: 22 U/L (ref 10–35)
Albumin: 4 g/dL (ref 3.6–5.1)
Alkaline phosphatase (APISO): 61 U/L (ref 37–153)
BUN/Creatinine Ratio: 33 (calc) — ABNORMAL HIGH (ref 6–22)
BUN: 34 mg/dL — ABNORMAL HIGH (ref 7–25)
CO2: 31 mmol/L (ref 20–32)
Calcium: 9.9 mg/dL (ref 8.6–10.4)
Chloride: 102 mmol/L (ref 98–110)
Creat: 1.02 mg/dL — ABNORMAL HIGH (ref 0.60–0.88)
GFR, Est African American: 56 mL/min/{1.73_m2} — ABNORMAL LOW (ref >=60)
GFR, Est Non African American: 48 mL/min/{1.73_m2} — ABNORMAL LOW (ref >=60)
Globulin: 3 g/dL (calc) (ref 1.9–3.7)
Glucose, Bld: 136 mg/dL (ref 65–139)
Potassium: 4.1 mmol/L (ref 3.5–5.3)
Sodium: 143 mmol/L (ref 135–146)
Total Bilirubin: 0.4 mg/dL (ref 0.2–1.2)
Total Protein: 7 g/dL (ref 6.1–8.1)

## 2020-03-20 LAB — CBC WITH DIFFERENTIAL/PLATELET
Absolute Monocytes: 428 cells/uL (ref 200–950)
Basophils Absolute: 27 cells/uL (ref 0–200)
Basophils Relative: 0.4 %
Eosinophils Absolute: 48 cells/uL (ref 15–500)
Eosinophils Relative: 0.7 %
HCT: 42.3 % (ref 35.0–45.0)
Hemoglobin: 14.2 g/dL (ref 11.7–15.5)
Lymphs Abs: 2081 cells/uL (ref 850–3900)
MCH: 31.7 pg (ref 27.0–33.0)
MCHC: 33.6 g/dL (ref 32.0–36.0)
MCV: 94.4 fL (ref 80.0–100.0)
MPV: 9.7 fL (ref 7.5–12.5)
Monocytes Relative: 6.3 %
Neutro Abs: 4216 cells/uL (ref 1500–7800)
Neutrophils Relative %: 62 %
Platelets: 242 10*3/uL (ref 140–400)
RBC: 4.48 10*6/uL (ref 3.80–5.10)
RDW: 11.9 % (ref 11.0–15.0)
Total Lymphocyte: 30.6 %
WBC: 6.8 10*3/uL (ref 3.8–10.8)

## 2020-03-20 LAB — TSH: TSH: 2.82 mIU/L (ref 0.40–4.50)

## 2020-04-30 ENCOUNTER — Other Ambulatory Visit: Payer: Self-pay | Admitting: Nurse Practitioner

## 2020-04-30 DIAGNOSIS — F0391 Unspecified dementia with behavioral disturbance: Secondary | ICD-10-CM

## 2020-05-01 NOTE — Telephone Encounter (Signed)
Patient has request refill on medications Donepezil, Memantine, Metoprolol, and Pravastatin. All medications are due for refill. All medications have warnings. Medications pend and sent to PCP Janyth Contes Janene Harvey, NP for approval.

## 2020-07-12 ENCOUNTER — Emergency Department (HOSPITAL_COMMUNITY): Payer: Medicare Other

## 2020-07-12 ENCOUNTER — Other Ambulatory Visit: Payer: Self-pay

## 2020-07-12 ENCOUNTER — Encounter (HOSPITAL_COMMUNITY): Payer: Self-pay | Admitting: Emergency Medicine

## 2020-07-12 ENCOUNTER — Emergency Department (HOSPITAL_COMMUNITY)
Admission: EM | Admit: 2020-07-12 | Discharge: 2020-07-12 | Disposition: A | Discharge status: Home / Self Care | Payer: Medicare Other | Attending: Emergency Medicine | Admitting: Emergency Medicine

## 2020-07-12 DIAGNOSIS — N182 Chronic kidney disease, stage 2 (mild): Secondary | ICD-10-CM | POA: Diagnosis not present

## 2020-07-12 DIAGNOSIS — I129 Hypertensive chronic kidney disease with stage 1 through stage 4 chronic kidney disease, or unspecified chronic kidney disease: Secondary | ICD-10-CM | POA: Insufficient documentation

## 2020-07-12 DIAGNOSIS — R42 Dizziness and giddiness: Secondary | ICD-10-CM | POA: Diagnosis not present

## 2020-07-12 DIAGNOSIS — F0390 Unspecified dementia without behavioral disturbance: Secondary | ICD-10-CM | POA: Insufficient documentation

## 2020-07-12 DIAGNOSIS — J90 Pleural effusion, not elsewhere classified: Secondary | ICD-10-CM | POA: Diagnosis not present

## 2020-07-12 DIAGNOSIS — R410 Disorientation, unspecified: Secondary | ICD-10-CM | POA: Diagnosis not present

## 2020-07-12 DIAGNOSIS — Z7982 Long term (current) use of aspirin: Secondary | ICD-10-CM | POA: Diagnosis not present

## 2020-07-12 DIAGNOSIS — I4510 Unspecified right bundle-branch block: Secondary | ICD-10-CM | POA: Diagnosis not present

## 2020-07-12 DIAGNOSIS — I2510 Atherosclerotic heart disease of native coronary artery without angina pectoris: Secondary | ICD-10-CM | POA: Insufficient documentation

## 2020-07-12 DIAGNOSIS — R55 Syncope and collapse: Secondary | ICD-10-CM | POA: Diagnosis not present

## 2020-07-12 DIAGNOSIS — R4020 Unspecified coma: Secondary | ICD-10-CM | POA: Diagnosis not present

## 2020-07-12 DIAGNOSIS — Z79899 Other long term (current) drug therapy: Secondary | ICD-10-CM | POA: Insufficient documentation

## 2020-07-12 LAB — CBC WITH DIFFERENTIAL/PLATELET
Abs Immature Granulocytes: 0.03 10*3/uL (ref 0.00–0.07)
Basophils Absolute: 0 10*3/uL (ref 0.0–0.1)
Basophils Relative: 0 %
Eosinophils Absolute: 0.1 10*3/uL (ref 0.0–0.5)
Eosinophils Relative: 1 %
HCT: 43 % (ref 36.0–46.0)
Hemoglobin: 14.2 g/dL (ref 12.0–15.0)
Immature Granulocytes: 0 %
Lymphocytes Relative: 19 %
Lymphs Abs: 1.9 10*3/uL (ref 0.7–4.0)
MCH: 31.8 pg (ref 26.0–34.0)
MCHC: 33 g/dL (ref 30.0–36.0)
MCV: 96.4 fL (ref 80.0–100.0)
Monocytes Absolute: 0.3 10*3/uL (ref 0.1–1.0)
Monocytes Relative: 3 %
Neutro Abs: 7.8 10*3/uL — ABNORMAL HIGH (ref 1.7–7.7)
Neutrophils Relative %: 77 %
Platelets: 183 10*3/uL (ref 150–400)
RBC: 4.46 MIL/uL (ref 3.87–5.11)
RDW: 11.8 % (ref 11.5–15.5)
WBC: 10.1 10*3/uL (ref 4.0–10.5)
nRBC: 0 % (ref 0.0–0.2)

## 2020-07-12 LAB — TROPONIN I (HIGH SENSITIVITY)
Troponin I (High Sensitivity): 5 ng/L (ref <18)
Troponin I (High Sensitivity): 6 ng/L (ref <18)

## 2020-07-12 LAB — BASIC METABOLIC PANEL
Anion gap: 6 (ref 5–15)
BUN: 17 mg/dL (ref 8–23)
CO2: 28 mmol/L (ref 22–32)
Calcium: 8.8 mg/dL — ABNORMAL LOW (ref 8.9–10.3)
Chloride: 105 mmol/L (ref 98–111)
Creatinine, Ser: 0.96 mg/dL (ref 0.44–1.00)
GFR, Estimated: 56 mL/min — ABNORMAL LOW (ref >60)
Glucose, Bld: 123 mg/dL — ABNORMAL HIGH (ref 70–99)
Potassium: 3.7 mmol/L (ref 3.5–5.1)
Sodium: 139 mmol/L (ref 135–145)

## 2020-07-12 LAB — CBG MONITORING, ED: Glucose-Capillary: 110 mg/dL — ABNORMAL HIGH (ref 70–99)

## 2020-07-12 MED ORDER — SODIUM CHLORIDE 0.9 % IV SOLN
1000.0000 mL | INTRAVENOUS | Status: DC
  Administered 2020-07-12: 1000 mL via INTRAVENOUS

## 2020-07-12 MED ORDER — SODIUM CHLORIDE 0.9 % IV BOLUS (SEPSIS)
500.0000 mL | Freq: Once | INTRAVENOUS | Status: AC
  Administered 2020-07-12: 500 mL via INTRAVENOUS

## 2020-07-12 NOTE — ED Triage Notes (Signed)
Pt arrives via EMS for syncopal episode at a [facility] center. Feeling dizzy, pt was helped to the ground and passed out. Pt vomited during episode. Did not aspirate. Pt was confused when she first woke up, but is now at baseline. Alert and oriented. Pt does not recall the event.  80/40 improved with NS  to 98/50. CBG 164

## 2020-07-12 NOTE — ED Provider Notes (Signed)
[facility] EMERGENCY DEPARTMENT Provider Note   CSN: [id number] Arrival date & time: 07/12/20  1120     History Chief complaint: Syncope  Julia Henson is a 87 y.o. female.  HPI  Patient presents to the ED for evaluation after a syncopal episode this morning.  Patient was at the senior rec center.  She started to feel lightheaded and dizzy.  She was helped to the ground and passed out.  Patient did have an episode of emesis.  Patient was initially confused when she woke up but now has returned to baseline.  Patient states she does not exactly know what happened.  She has never had this issue before.  She is not having any trouble with chest pain.  She is not having any abdominal pain.  She denies any fevers or chills.  EMS administered the patient IV fluids.  She is feeling better now.  Past Medical History:  Diagnosis Date   Brittle nails    CAD (coronary artery disease)    mild by cath in 2010   Cellulitis    Cellulitis of left anterior lower leg    Chronic kidney disease, stage II (mild)    Dementia with behavioral disturbance ([facility])    Depression with anxiety 05/25/2007   Dyslipidemia    GERD (gastroesophageal reflux disease)    Hallucinations    Head ache    Hordeolum externum of right eye    Hypertension    Leg ulcer, left, limited to breakdown of skin ([facility])    Mitral insufficiency    MVP (mitral valve prolapse)    Occult blood in stools    Open wound of lower leg, right, sequela    Open wound of lower leg, right, subsequent encounter    stage 3 ulcer   Osteopenia    Skin lesion of hand    Urinary frequency    Urticaria    Vertigo     Patient Active Problem List   Diagnosis Date Noted   Senile purpura ([facility]) 07/27/2017   Osteopenia 07/27/2017   Insomnia 07/27/2017   Dementia with behavioral disturbance ([facility]) 05/05/2017   CAD (coronary artery disease) 02/28/2013   Essential hypertension 02/28/2013   Dyslipidemia 02/28/2013    Past  Surgical History:  Procedure Laterality Date   APPENDECTOMY     CARDIAC CATHETERIZATION  06/16/2008   normal LV systolic function, MVP with some mild MR, prox LAD disease (50%) - Dr. Langston Reusing   CARDIAC CATHETERIZATION  10/16/2005   normal LV systolic function, MVP with trace to 1+ MR, mild nonobstructive CAD involving LAD 30% prox, 10-20 narrowing in prox LAD (Dr. Bishop Limbo)   CATARACT EXTRACTION, BILATERAL     CHOLECYSTECTOMY     NM MYOCAR PERF WALL MOTION  02/2011   lexiscan myoview; perfusion dfect of 5% of LV myocardium, small area of anteroseptal breast attenuation artifact, no reversible ischemia, EF 81%, abnormal but low risk scan   TONSILLECTOMY       OB History   No obstetric history on file.     Family History  Problem Relation Age of Onset   Heart attack Mother 19   Pneumonia Father 13   Alzheimer's disease Father    Diabetes Sister 81   Heart Problems Brother 11   Dementia Sister 31   Pneumonia Sister        18 weeks old   Dementia Sister 90   Alzheimer's disease Sister 51    Social History  Tobacco Use   Smoking status: Never   Smokeless tobacco: Never  Vaping Use   Vaping Use: Never used  Substance Use Topics   Alcohol use: No   Drug use: No    Home Medications Prior to Admission medications   Medication Sig Start Date End Date Taking? Authorizing Provider  acetaminophen (TYLENOL) 325 MG tablet Take 325 mg by mouth as needed (back pain).   Yes [provider]  ALPRAZolam Prudy Feeler(XANAX) 0.5 MG tablet Take 1 tablet by mouth at bedtime.   Yes [provider]  amoxicillin (AMOXIL) 500 MG capsule Take 4 capsules by mouth 1 hour prior to dental appointment every 4 months 03/09/18  Yes [provider]  aspirin 81 MG tablet Take 81 mg by mouth daily.   Yes [provider]  Calcium Carbonate-Vitamin D 600-400 MG-UNIT tablet Take 1 tablet by mouth daily.   Yes [provider]  donepezil (ARICEPT) 10 MG tablet TAKE 1 TABLET BY  MOUTH AT  BEDTIME 05/01/20  Yes Sharon SellerEubanks, Jessica K, NP  memantine (NAMENDA) 10 MG tablet TAKE 1 TABLET BY MOUTH  TWICE DAILY 05/01/20  Yes Sharon SellerEubanks, Jessica K, NP  metoprolol succinate (TOPROL-XL) 25 MG 24 hr tablet TAKE ONE-HALF TABLET BY  MOUTH DAILY 05/01/20  Yes Sharon SellerEubanks, Jessica K, NP  Multiple Vitamins-Minerals (CENTRUM SILVER PO) Take 1 tablet by mouth daily.    Yes [provider]  Nutritional Supplements (ENSURE HIGH PROTEIN) LIQD Drink one can by mouth daily 07/04/19  Yes Ngetich, Dinah C, NP  pravastatin (PRAVACHOL) 40 MG tablet TAKE 1 TABLET BY MOUTH  DAILY 05/01/20  Yes Sharon SellerEubanks, Jessica K, NP  traZODone (DESYREL) 50 MG tablet TAKE 0.5-1 TABLETS (25-50 MG TOTAL) BY MOUTH AT BEDTIME AS NEEDED FOR SLEEP. 02/27/20  Yes Sharon SellerEubanks, Jessica K, NP    Allergies    Corn-containing products, Omeprazole, Other, Phenobarbital, and Sulfa antibiotics  Review of Systems   Review of Systems  All other systems reviewed and are negative.  Physical Exam Updated Vital Signs BP (!) 166/86 (BP Location: Right Arm)   Pulse (!) 57   Temp (!) 97.5 F (36.4 C) (Oral)   Resp 18   SpO2 98%   Physical Exam Vitals and nursing note reviewed.  Constitutional:      Appearance: She is well-developed.     Comments: Elderly, frail  HENT:     Head: Normocephalic and atraumatic.     Right Ear: External ear normal.     Left Ear: External ear normal.  Eyes:     General: No scleral icterus.       Right eye: No discharge.        Left eye: No discharge.     Conjunctiva/sclera: Conjunctivae normal.  Neck:     Trachea: No tracheal deviation.  Cardiovascular:     Rate and Rhythm: Normal rate and regular rhythm.  Pulmonary:     Effort: Pulmonary effort is normal. No respiratory distress.     Breath sounds: Normal breath sounds. No stridor. No wheezing or rales.  Abdominal:     General: Bowel sounds are normal. There is no distension.     Palpations: Abdomen is soft.     Tenderness: There is no abdominal  tenderness. There is no guarding or rebound.  Musculoskeletal:        General: No tenderness or deformity.     Cervical back: Neck supple.  Skin:    General: Skin is warm and dry.     Findings:  No rash.  Neurological:     General: No focal deficit present.     Mental Status: She is alert.     Cranial Nerves: No cranial nerve deficit (no facial droop, extraocular movements intact, no slurred speech).     Sensory: No sensory deficit.     Motor: No abnormal muscle tone or seizure activity.     Coordination: Coordination normal.  Psychiatric:        Mood and Affect: Mood normal.    ED Results / Procedures / Treatments   Labs (all labs ordered are listed, but only abnormal results are displayed) Labs Reviewed  BASIC METABOLIC PANEL - Abnormal; Notable for the following components:      Result Value   Glucose, Bld 123 (*)    Calcium 8.8 (*)    GFR, Estimated 56 (*)    All other components within normal limits  CBC WITH DIFFERENTIAL/PLATELET - Abnormal; Notable for the following components:   Neutro Abs 7.8 (*)    All other components within normal limits  CBG MONITORING, ED - Abnormal; Notable for the following components:   Glucose-Capillary 110 (*)    All other components within normal limits  CBC WITH DIFFERENTIAL/PLATELET  TROPONIN I (HIGH SENSITIVITY)  TROPONIN I (HIGH SENSITIVITY)    EKG EKG Interpretation  Date/Time:  Thursday July 12 2020 11:34:52 EDT Ventricular Rate:  60 PR Interval:  227 QRS Duration: 151 QT Interval:  513 QTC Calculation: 513 R Axis:   31 Text Interpretation: Sinus rhythm Prolonged PR interval Right bundle branch block No old tracing to compare Confirmed by Linwood Dibbles [id number]) on 07/12/2020 11:37:01 AM  Radiology DG Chest Port 1 View  Result Date: 07/12/2020 CLINICAL DATA:  [facility] EXAM: PORTABLE CHEST 1 VIEW COMPARISON:  03/05/2017 FINDINGS: Heart size is normal. Aorta is tortuous. The lungs are free of  focal consolidations and pleural effusions. No pulmonary edema. IMPRESSION: No active disease. Electronically Signed   By: Norva Pavlov M.D.   On: 07/12/2020 12:35    Procedures Procedures   Medications Ordered in ED Medications  sodium chloride 0.9 % bolus 500 mL (0 mLs Intravenous Stopped 07/12/20 1254)    Followed by  0.9 %  sodium chloride infusion (1,000 mLs Intravenous New Bag/Given 07/12/20 1254)    ED Course  I have reviewed the triage vital signs and the nursing notes.  Pertinent labs & imaging results that were available during my care of the patient were reviewed by me and considered in my medical decision making (see chart for details).  Clinical Course as of 07/12/20 1514  Thu Jul 12, 2020  1256 Patient's initial troponin is 5.  Metabolic panel is unremarkable [JK]  1429 CBC is normal.  Initial troponin is normal [JK]    Clinical Course User Index [JK] Linwood Dibbles, MD   MDM Rules/Calculators/A&P                          Patient presented to the ED after a syncopal episode.  Sounds that she may have had a vasovagal episode.  Patient did have an episode of vomiting.  However cannot completely exclude some type of cardiac dysrhythmia.  Patient was monitored in the ED and she did not have any further episodes.  Her laboratory tests are reassuring.  No signs of anemia dehydration or acute coronary syndrome.  Patient would like to go home.  She does not want to be admitted  to the hospital.  I discussed options of being admitted to the hospital for further evaluation, discussed possibility of cardiac etiology.  Patient and daughter are still comfortable with discharge Final Clinical Impression(s) / ED Diagnoses Final diagnoses:  Syncope, unspecified syncope type    Rx / DC Orders ED Discharge Orders     None        Linwood Dibbles, MD 07/12/20 [phone]

## 2020-07-12 NOTE — Discharge Instructions (Addendum)
Continue your current medications.  Follow up with your doctor to be rechecked.  Return to the ED for recurrent symptoms

## 2020-08-23 ENCOUNTER — Other Ambulatory Visit: Payer: Self-pay | Admitting: Nurse Practitioner

## 2020-08-23 DIAGNOSIS — G4700 Insomnia, unspecified: Secondary | ICD-10-CM

## 2020-08-23 NOTE — Telephone Encounter (Signed)
Patient has request refill on medication "Trazodone 50mg ". Patient last refill was 02/27/20 with 1 refill. Patient is due for refill again. Medication pend and sent to PCP 04/26/20 Janyth Contes, NP for approval.

## 2020-09-19 ENCOUNTER — Ambulatory Visit: Payer: Medicare Other | Admitting: Nurse Practitioner

## 2020-09-21 ENCOUNTER — Ambulatory Visit (INDEPENDENT_AMBULATORY_CARE_PROVIDER_SITE_OTHER): Payer: Medicare Other | Admitting: Nurse Practitioner

## 2020-09-21 ENCOUNTER — Encounter: Payer: Self-pay | Admitting: Nurse Practitioner

## 2020-09-21 ENCOUNTER — Other Ambulatory Visit: Payer: Self-pay

## 2020-09-21 DIAGNOSIS — Z0000 Encounter for general adult medical examination without abnormal findings: Secondary | ICD-10-CM

## 2020-09-21 NOTE — Patient Instructions (Signed)
Julia Henson , Thank you for taking time to come for your Medicare Wellness Visit. I appreciate your ongoing commitment to your health goals. Please review the following plan we discussed and let me know if I can assist you in the future.   Screening recommendations/referrals: Colonoscopy aged out Mammogram aged out Bone Density declined at this time.  Recommended yearly ophthalmology/optometry visit for glaucoma screening and checkup Recommended yearly dental visit for hygiene and checkup  Vaccinations: Influenza vaccine recommended annually- due at this time Pneumococcal vaccine up to date Tdap vaccine up to date Shingles vaccine RECOMMENDED- make sure she has had 2 vaccines    Advanced directives: recommended to complete   Conditions/risks identified: advance age, progressive memory loss  Next appointment: yearly AWV   Preventive Care 44 Years and Older, Female Preventive care refers to lifestyle choices and visits with your health care provider that can promote health and wellness. What does preventive care include? A yearly physical exam. This is also called an annual well check. Dental exams once or twice a year. Routine eye exams. Ask your health care provider how often you should have your eyes checked. Personal lifestyle choices, including: Daily care of your teeth and gums. Regular physical activity. Eating a healthy diet. Avoiding tobacco and drug use. Limiting alcohol use. Practicing safe sex. Taking low-dose aspirin every day. Taking vitamin and mineral supplements as recommended by your health care provider. What happens during an annual well check? The services and screenings done by your health care provider during your annual well check will depend on your age, overall health, lifestyle risk factors, and family history of disease. Counseling  Your health care provider may ask you questions about your: Alcohol use. Tobacco use. Drug use. Emotional  well-being. Home and relationship well-being. Sexual activity. Eating habits. History of falls. Memory and ability to understand (cognition). Work and work Astronomer. Reproductive health. Screening  You may have the following tests or measurements: Height, weight, and BMI. Blood pressure. Lipid and cholesterol levels. These may be checked every 5 years, or more frequently if you are over 85 years old. Skin check. Lung cancer screening. You may have this screening every year starting at age 67 if you have a 30-pack-year history of smoking and currently smoke or have quit within the past 15 years. Fecal occult blood test (FOBT) of the stool. You may have this test every year starting at age 1. Flexible sigmoidoscopy or colonoscopy. You may have a sigmoidoscopy every 5 years or a colonoscopy every 10 years starting at age 72. Hepatitis C blood test. Hepatitis B blood test. Sexually transmitted disease (STD) testing. Diabetes screening. This is done by checking your blood sugar (glucose) after you have not eaten for a while (fasting). You may have this done every 1-3 years. Bone density scan. This is done to screen for osteoporosis. You may have this done starting at age 54. Mammogram. This may be done every 1-2 years. Talk to your health care provider about how often you should have regular mammograms. Talk with your health care provider about your test results, treatment options, and if necessary, the need for more tests. Vaccines  Your health care provider may recommend certain vaccines, such as: Influenza vaccine. This is recommended every year. Tetanus, diphtheria, and acellular pertussis (Tdap, Td) vaccine. You may need a Td booster every 10 years. Zoster vaccine. You may need this after age 61. Pneumococcal 13-valent conjugate (PCV13) vaccine. One dose is recommended after age 72. Pneumococcal polysaccharide (PPSV23) vaccine.  One dose is recommended after age 25. Talk to your  health care provider about which screenings and vaccines you need and how often you need them. This information is not intended to replace advice given to you by your health care provider. Make sure you discuss any questions you have with your health care provider. Document Released: 02/02/2015 Document Revised: 09/26/2015 Document Reviewed: 11/07/2014 Elsevier Interactive Patient Education  2017 [place] Prevention in the Home Falls can cause injuries. They can happen to people of all ages. There are many things you can do to make your home safe and to help prevent falls. What can I do on the outside of my home? Regularly fix the edges of walkways and driveways and fix any cracks. Remove anything that might make you trip as you walk through a door, such as a raised step or threshold. Trim any bushes or trees on the path to your home. Use bright outdoor lighting. Clear any walking paths of anything that might make someone trip, such as rocks or tools. Regularly check to see if handrails are loose or broken. Make sure that both sides of any steps have handrails. Any raised decks and porches should have guardrails on the edges. Have any leaves, snow, or ice cleared regularly. Use sand or salt on walking paths during winter. Clean up any spills in your garage right away. This includes oil or grease spills. What can I do in the bathroom? Use night lights. Install grab bars by the toilet and in the tub and shower. Do not use towel bars as grab bars. Use non-skid mats or decals in the tub or shower. If you need to sit down in the shower, use a plastic, non-slip stool. Keep the floor dry. Clean up any water that spills on the floor as soon as it happens. Remove soap buildup in the tub or shower regularly. Attach bath mats securely with double-sided non-slip rug tape. Do not have throw rugs and other things on the floor that can make you trip. What can I do in the bedroom? Use night  lights. Make sure that you have a light by your bed that is easy to reach. Do not use any sheets or blankets that are too big for your bed. They should not hang down onto the floor. Have a firm chair that has side arms. You can use this for support while you get dressed. Do not have throw rugs and other things on the floor that can make you trip. What can I do in the kitchen? Clean up any spills right away. Avoid walking on wet floors. Keep items that you use a lot in easy-to-reach places. If you need to reach something above you, use a strong step stool that has a grab bar. Keep electrical cords out of the way. Do not use floor polish or wax that makes floors slippery. If you must use wax, use non-skid floor wax. Do not have throw rugs and other things on the floor that can make you trip. What can I do with my stairs? Do not leave any items on the stairs. Make sure that there are handrails on both sides of the stairs and use them. Fix handrails that are broken or loose. Make sure that handrails are as long as the stairways. Check any carpeting to make sure that it is firmly attached to the stairs. Fix any carpet that is loose or worn. Avoid having throw rugs at the top or bottom of  the stairs. If you do have throw rugs, attach them to the floor with carpet tape. Make sure that you have a light switch at the top of the stairs and the bottom of the stairs. If you do not have them, ask someone to add them for you. What else can I do to help prevent falls? Wear shoes that: Do not have high heels. Have rubber bottoms. Are comfortable and fit you well. Are closed at the toe. Do not wear sandals. If you use a stepladder: Make sure that it is fully opened. Do not climb a closed stepladder. Make sure that both sides of the stepladder are locked into place. Ask someone to hold it for you, if possible. Clearly mark and make sure that you can see: Any grab bars or handrails. First and last  steps. Where the edge of each step is. Use tools that help you move around (mobility aids) if they are needed. These include: Canes. Walkers. Scooters. Crutches. Turn on the lights when you go into a dark area. Replace any light bulbs as soon as they burn out. Set up your furniture so you have a clear path. Avoid moving your furniture around. If any of your floors are uneven, fix them. If there are any pets around you, be aware of where they are. Review your medicines with your doctor. Some medicines can make you feel dizzy. This can increase your chance of falling. Ask your doctor what other things that you can do to help prevent falls. This information is not intended to replace advice given to you by your health care provider. Make sure you discuss any questions you have with your health care provider. Document Released: 11/02/2008 Document Revised: 06/14/2015 Document Reviewed: 02/10/2014 Elsevier Interactive Patient Education  2017 [employer].

## 2020-09-21 NOTE — Progress Notes (Signed)
  This service is provided via telemedicine  No vital signs collected/recorded due to the encounter was a telemedicine visit.   Location of patient (ex: home, work):  Home.  Patient consents to a telephone visit:  Yes  Location of the provider (ex: office, home):  [facility].  Name of any referring provider:  Sharon Seller, NP   Names of all persons participating in the telemedicine service and their role in the encounter:  Patient, [name]/Daughter Meda Klinefelter, RMA, Abbey Chatters NP.    Time spent on call: 8 minutes spent on the phone with Medical Assistant.

## 2020-09-21 NOTE — Progress Notes (Signed)
Subjective:   Julia Henson is a 87 y.o. female who presents for Medicare Annual (Subsequent) preventive examination.  Review of Systems     Cardiac Risk Factors include: advanced age (>28men, >60 women);sedentary lifestyle;hypertension;dyslipidemia     Objective:    There were no vitals filed for this visit. There is no height or weight on file to calculate BMI.  Advanced Directives 09/21/2020 07/12/2020 03/19/2020 09/14/2019 07/04/2019 06/10/2019 05/16/2019  Does Patient Have a Medical Advance Directive? Yes Unable to assess, patient is non-responsive or altered mental status Yes Yes Yes Yes Yes  Type of Advance Directive Out of facility DNR (pink MOST or yellow form) - Out of facility DNR (pink MOST or yellow form) Out of facility DNR (pink MOST or yellow form) Out of facility DNR (pink MOST or yellow form);Healthcare Power of [employer] of [place];Living will;Out of facility DNR (pink MOST or yellow form) Healthcare Power of [place];Living will;Out of facility DNR (pink MOST or yellow form)  Does patient want to make changes to medical advance directive? No - Patient declined - No - Patient declined No - Patient declined No - Patient declined No - Patient declined No - Patient declined  Copy of Healthcare Power of Attorney in Chart? - - - - No - copy requested Yes - validated most recent copy scanned in chart (See row information) Yes - validated most recent copy scanned in chart (See row information)  Pre-existing out of facility DNR order (yellow form or pink MOST form) - - Yellow form placed in chart (order not valid for inpatient use);Pink MOST form placed in chart (order not valid for inpatient use) Yellow form placed in chart (order not valid for inpatient use);Pink MOST form placed in chart (order not valid for inpatient use) - - -    Current Medications (verified) Outpatient Encounter Medications as of 09/21/2020  Medication Sig   acetaminophen (TYLENOL) 325 MG  tablet Take 325 mg by mouth as needed (back pain).   ALPRAZolam (XANAX) 0.5 MG tablet Take 1 tablet by mouth at bedtime.   amoxicillin (AMOXIL) 500 MG capsule Take 4 capsules by mouth 1 hour prior to dental appointment every 4 months   aspirin 81 MG tablet Take 81 mg by mouth daily.   Calcium Carbonate-Vitamin D 600-400 MG-UNIT tablet Take 1 tablet by mouth daily.   donepezil (ARICEPT) 10 MG tablet TAKE 1 TABLET BY MOUTH AT  BEDTIME   memantine (NAMENDA) 10 MG tablet TAKE 1 TABLET BY MOUTH  TWICE DAILY   metoprolol succinate (TOPROL-XL) 25 MG 24 hr tablet TAKE ONE-HALF TABLET BY  MOUTH DAILY   Multiple Vitamins-Minerals (CENTRUM SILVER PO) Take 1 tablet by mouth daily.    Nutritional Supplements (ENSURE HIGH PROTEIN) LIQD Drink one can by mouth daily   pravastatin (PRAVACHOL) 40 MG tablet TAKE 1 TABLET BY MOUTH  DAILY   traZODone (DESYREL) 50 MG tablet TAKE 0.5-1 TABLETS BY MOUTH AT BEDTIME AS NEEDED FOR SLEEP.   No facility-administered encounter medications on file as of 09/21/2020.    Allergies (verified) Corn-containing products, Omeprazole, Other, Phenobarbital, and Sulfa antibiotics   History: Past Medical History:  Diagnosis Date   Brittle nails    CAD (coronary artery disease)    mild by cath in 2010   Cellulitis    Cellulitis of left anterior lower leg    Chronic kidney disease, stage II (mild)    Dementia with behavioral disturbance (HCC)    Depression with anxiety 05/25/2007   Dyslipidemia  GERD (gastroesophageal reflux disease)    Hallucinations    Head ache    Hordeolum externum of right eye    Hypertension    Leg ulcer, left, limited to breakdown of skin (HCC)    Mitral insufficiency    MVP (mitral valve prolapse)    Occult blood in stools    Open wound of lower leg, right, sequela    Open wound of lower leg, right, subsequent encounter    stage 3 ulcer   Osteopenia    Skin lesion of hand    Urinary frequency    Urticaria    Vertigo    Past Surgical  History:  Procedure Laterality Date   APPENDECTOMY     CARDIAC CATHETERIZATION  06/16/2008   normal LV systolic function, MVP with some mild MR, prox LAD disease (50%) - Dr. Langston Reusing   CARDIAC CATHETERIZATION  10/16/2005   normal LV systolic function, MVP with trace to 1+ MR, mild nonobstructive CAD involving LAD 30% prox, 10-20 narrowing in prox LAD (Dr. Bishop Limbo)   CATARACT EXTRACTION, BILATERAL     CHOLECYSTECTOMY     NM MYOCAR PERF WALL MOTION  02/2011   lexiscan myoview; perfusion dfect of 5% of LV myocardium, small area of anteroseptal breast attenuation artifact, no reversible ischemia, EF 81%, abnormal but low risk scan   TONSILLECTOMY     Family History  Problem Relation Age of Onset   Heart attack Mother 54   Pneumonia Father 104   Alzheimer's disease Father    Diabetes Sister 82   Heart Problems Brother 84   Dementia Sister 82   Pneumonia Sister        33 weeks old   Dementia Sister 2   Alzheimer's disease Sister 34   Social History   Socioeconomic History   Marital status: Widowed    Spouse name: Not on file   Number of children: 1   Years of education: 10   Highest education level: Not on file  Occupational History    Employer: RETIRED  Tobacco Use   Smoking status: Never   Smokeless tobacco: Never  Vaping Use   Vaping Use: Never used  Substance and Sexual Activity   Alcohol use: No   Drug use: No   Sexual activity: Not Currently  Other Topics Concern   Not on file  Social History Narrative   Social History      Diet?       Do you drink/eat things with caffeine?  Coffee, once daily      Marital status?       widowed                             What year were you married? 1949   Children- 1   Do you live in a house, apartment, assisted living, condo, trailer, etc.?    05/05/17 living with dgtr, Ephraim Hamburger      Is it one or more stories? one      How many persons live in your home? 2      Do you have any pets in your home? (please list) yes,  Lallie's dog-Susie and daughter's dog Molly      Highest level of education completed? 12      Current or past profession: [employer]      Do you exercise?  yes                        Type & how often? Chair exercises at [facility] in [place]      Advanced Directives      Do you have a living will? yes      Do you have a DNR form?                                  If not, do you want to discuss one?      Do you have signed POA/HPOA for forms?  yes      Functional Status      Do you have difficulty bathing or dressing yourself? no      Do you have difficulty preparing food or eating? No- daughter prepares food now      Do you have difficulty managing your medications? yes      Do you have difficulty managing your finances? yes      Do you have difficulty affording your medications? No-insurance   Social Determinants of Health   Financial Resource Strain: Not on file  Food Insecurity: Not on file  Transportation Needs: Not on file  Physical Activity: Not on file  Stress: Not on file  Social Connections: Not on file    Tobacco Counseling Counseling given: Not Answered   Clinical Intake:  Pre-visit preparation completed: Yes  Pain : No/denies pain     BMI - recorded: 18 Nutritional Status: BMI of 19-24  Normal  How often do you need to have someone help you when you read instructions, pamphlets, or other written materials from your doctor or pharmacy?: 3 - Sometimes  Diabetic?no         Activities of Daily Living In your present state of health, do you have any difficulty performing the following activities: 09/21/2020  Hearing? N  Vision? N  Difficulty concentrating or making decisions? Y  Walking or climbing stairs? N  Dressing or bathing? N  Doing errands, shopping? Y  Preparing Food and eating ? Y  Using the Toilet? N  In the past six months, have you accidently leaked urine? N  Do you have problems with loss  of bowel control? N  Managing your Medications? Y  Managing your Finances? Y  Housekeeping or managing your Housekeeping? Y  Some recent data might be hidden    Patient Care Team: Sharon Seller, NP as PCP - General (Geriatric Medicine)  Indicate any recent Medical Services you may have received from other than [facility] providers in the past year (date may be approximate).     Assessment:   This is a routine wellness examination for Julia Henson.  Hearing/Vision screen Hearing Screening - Comments:: No Hearing Concerns. Patient doesn't wear hearing aids.  Vision Screening - Comments:: No Vision Concerns. Patient wears prescription glasses. Patient last eye exam 2021.  Dietary issues and exercise activities discussed: Current Exercise Habits: Structured exercise class, Type of exercise: calisthenics, Time (Minutes): 20, Frequency (Times/Week): 5, Weekly Exercise (Minutes/Week): 100   Goals Addressed   None    Depression Screen PHQ 2/9 Scores 09/21/2020 03/19/2020 06/10/2019 05/16/2019 05/12/2018 05/12/2018 07/27/2017  PHQ - 2 Score 0 0 0 0 0 0 0    Fall Risk Fall Risk  09/21/2020 03/19/2020 07/04/2019 06/10/2019 05/16/2019  Falls in the past year? 0 0 1 0 0  Number falls  in past yr: 0 0 1 0 0  Injury with Fall? 0 0 1 0 0  Comment - - - - -  Risk for fall due to : No Fall Risks - - - -  Risk for fall due to: Comment - - - - -  Follow up Falls evaluation completed - - - -    FALL RISK PREVENTION PERTAINING TO THE HOME:  Any stairs in or around the home? Yes  If so, are there any without handrails? No  Home free of loose throw rugs in walkways, pet beds, electrical cords, etc? Yes  Adequate lighting in your home to reduce risk of falls? Yes   ASSISTIVE DEVICES UTILIZED TO PREVENT FALLS:  Life alert? No  Use of a cane, walker or w/c? Yes  Grab bars in the bathroom? Yes  Shower chair or bench in shower? Yes  Elevated toilet seat or a handicapped toilet? Yes   TIMED UP AND GO:  Was  the test performed? No .   Cognitive Function: MMSE - Mini Mental State Exam 05/05/2017 03/30/2017  Orientation to time 0 3  Orientation to Place 4 3  Registration 2 0  Attention/ Calculation 1 0  Recall 0 2  Language- name 2 objects 2 2  Language- repeat 0 0  Language- follow 3 step command 3 3  Language- read & follow direction 1 1  Write a sentence 0 1  Copy design 1 1  Total score 14 16     6CIT Screen 06/10/2019 05/16/2019 05/12/2018  What Year? 4 points 4 points 0 points  What month? 3 points 3 points 3 points  What time? 3 points 3 points 0 points  Count back from 20 2 points 0 points 0 points  Months in reverse 4 points 4 points 2 points  Repeat phrase 10 points 10 points 10 points  Total Score 26 24 15     Immunizations Immunization History  Administered Date(s) Administered   Fluad Quad(high Dose 65+) 10/10/2018, 12/19/2019   Influenza, High Dose Seasonal PF 12/14/2017   Influenza-Unspecified 12/20/2012, 02/16/2015   PFIZER(Purple Top)SARS-COV-2 Vaccination 08/30/2019, 09/20/2019   Pneumococcal Conjugate-13 02/01/2014   Pneumococcal Polysaccharide-23 01/20/1994   Tdap 05/07/2011   Zoster Recombinat (Shingrix) 09/19/2016    TDAP status: Up to date  Flu Vaccine status: Due, Education has been provided regarding the importance of this vaccine. Advised may receive this vaccine at local pharmacy or Health Dept. Aware to provide a copy of the vaccination record if obtained from local pharmacy or Health Dept. Verbalized acceptance and understanding.  Pneumococcal vaccine status: Up to date  Covid-19 vaccine status: Information provided on how to obtain vaccines.   Qualifies for Shingles Vaccine? Yes   Zostavax completed No   Shingrix Completed?: No.    Education has been provided regarding the importance of this vaccine. Patient has been advised to call insurance company to determine out of pocket expense if they have not yet received this vaccine. Advised may also  receive vaccine at local pharmacy or Health Dept. Verbalized acceptance and understanding.  Screening Tests Health Maintenance  Topic Date Due   Zoster Vaccines- Shingrix (2 of 2) 11/14/2016   COVID-19 Vaccine (3 - Booster for Pfizer series) 02/20/2020   INFLUENZA VACCINE  08/20/2020   TETANUS/TDAP  05/06/2021   DEXA SCAN  Completed   PNA vac Low Risk Adult  Completed   HPV VACCINES  Aged Out    Health Maintenance  Health Maintenance Due  Topic Date  Due   Zoster Vaccines- Shingrix (2 of 2) 11/14/2016   COVID-19 Vaccine (3 - Booster for Pfizer series) 02/20/2020   INFLUENZA VACCINE  08/20/2020    Colorectal cancer screening: No longer required.   Mammogram status: No longer required due to aged out.  Bone density declined  Lung Cancer Screening: (Low Dose CT Chest recommended if Age 7-80 years, 30 pack-year currently smoking OR have quit w/in 15years.) does not qualify.   Lung Cancer Screening Referral: na  Additional Screening:  Hepatitis C Screening: does not qualify  Vision Screening: Recommended annual ophthalmology exams for early detection of glaucoma and other disorders of the eye. Is the patient up to date with their annual eye exam?  Yes  Who is the provider or what is the name of the office in which the patient attends annual eye exams? [facility] If pt is not established with a provider, would they like to be referred to a provider to establish care? No .   Dental Screening: Recommended annual dental exams for proper oral hygiene  Community Resource Referral / Chronic Care Management: CRR required this visit?  No   CCM required this visit?  No      Plan:     I have personally reviewed and noted the following in the patient's chart:   Medical and social history Use of alcohol, tobacco or illicit drugs  Current medications and supplements including opioid prescriptions.  Functional ability and status Nutritional status Physical  activity Advanced directives List of other physicians Hospitalizations, surgeries, and ER visits in previous 12 months Vitals Screenings to include cognitive, depression, and falls Referrals and appointments  In addition, I have reviewed and discussed with patient certain preventive protocols, quality metrics, and best practice recommendations. A written personalized care plan for preventive services as well as general preventive health recommendations were provided to patient.     Sharon Seller, NP   09/21/2020    Virtual Visit via Telephone Note  I connected withNAME@ on 09/21/20 at  3:45 PM EDT by telephone and verified that I am speaking with the correct person using two identifiers.  Location: Patient: home Provider: office   I discussed the limitations, risks, security and privacy concerns of performing an evaluation and management service by telephone and the availability of in person appointments. I also discussed with the patient that there may be a patient responsible charge related to this service. The patient expressed understanding and agreed to proceed.   I discussed the assessment and treatment plan with the patient. The patient was provided an opportunity to ask questions and all were answered. The patient agreed with the plan and demonstrated an understanding of the instructions.   The patient was advised to call back or seek an in-person evaluation if the symptoms worsen or if the condition fails to improve as anticipated.  I provided 15 minutes of non-face-to-face time during this encounter.  Janene Harvey. Biagio Borg Avs printed and mailed

## 2020-10-01 ENCOUNTER — Other Ambulatory Visit: Payer: Self-pay

## 2020-10-01 ENCOUNTER — Encounter: Payer: Self-pay | Admitting: Nurse Practitioner

## 2020-10-01 ENCOUNTER — Ambulatory Visit (INDEPENDENT_AMBULATORY_CARE_PROVIDER_SITE_OTHER): Payer: Medicare Other | Admitting: Nurse Practitioner

## 2020-10-01 VITALS — BP 124/68 | HR 64 | Temp 97.2°F | Ht 65.0 in | Wt 114.0 lb

## 2020-10-01 DIAGNOSIS — E785 Hyperlipidemia, unspecified: Secondary | ICD-10-CM | POA: Diagnosis not present

## 2020-10-01 DIAGNOSIS — F0391 Unspecified dementia with behavioral disturbance: Secondary | ICD-10-CM | POA: Diagnosis not present

## 2020-10-01 DIAGNOSIS — I10 Essential (primary) hypertension: Secondary | ICD-10-CM | POA: Diagnosis not present

## 2020-10-01 DIAGNOSIS — Z23 Encounter for immunization: Secondary | ICD-10-CM

## 2020-10-01 DIAGNOSIS — G4700 Insomnia, unspecified: Secondary | ICD-10-CM

## 2020-10-01 DIAGNOSIS — D692 Other nonthrombocytopenic purpura: Secondary | ICD-10-CM

## 2020-10-01 DIAGNOSIS — M8580 Other specified disorders of bone density and structure, unspecified site: Secondary | ICD-10-CM | POA: Diagnosis not present

## 2020-10-01 NOTE — Progress Notes (Signed)
Careteam: Patient Care Team: Sharon Seller, NP as PCP - General (Geriatric Medicine)  PLACE OF SERVICE:  [facility]  Advanced Directive information    Allergies  Allergen Reactions   Corn-Containing Products    Omeprazole Other (See Comments)   Other Hives    Grapes and banana concentrate   Phenobarbital Rash    rash   Sulfa Antibiotics Rash    Chief Complaint  Patient presents with   Medical Management of Chronic Issues    6 month follow-up. Discuss need for shingrix and covid vaccines or exclude. Patient had flu vaccine this season per daughter. Patient is sleeping a lot more during the day. Patient eats a lot of sweets. Here with daughter Rinaldo Cloud      HPI: Patient is a 87 y.o. female for routine follow up.   Sleeping a lot more during the day. If she does not go to the center she will go back to bed. Does not effect her night sleep.   Goes to a day program Monday-Friday  She does exercises and bingo.  Eats snack and meal there.  She is participating.   She resists bathing. Does not like to go anywhere except center.   Does not want to get her eyes checked- she does not like to read, does not use the glasses she has.   She has 3 cavities but plan to watch for infection.   Has syncopal episode in June, went to [facility] ED. Did extensive testing and did not find anything.    Review of Systems:  Review of Systems  Unable to perform ROS: Dementia   Past Medical History:  Diagnosis Date   Brittle nails    CAD (coronary artery disease)    mild by cath in 2010   Cellulitis    Cellulitis of left anterior lower leg    Chronic kidney disease, stage II (mild)    Dementia with behavioral disturbance (HCC)    Depression with anxiety 05/25/2007   Dyslipidemia    GERD (gastroesophageal reflux disease)    Hallucinations    Head ache    Hordeolum externum of right eye    Hypertension    Leg ulcer, left, limited to breakdown of skin (HCC)    Mitral  insufficiency    MVP (mitral valve prolapse)    Occult blood in stools    Open wound of lower leg, right, sequela    Open wound of lower leg, right, subsequent encounter    stage 3 ulcer   Osteopenia    Skin lesion of hand    Urinary frequency    Urticaria    Vertigo    Past Surgical History:  Procedure Laterality Date   APPENDECTOMY     CARDIAC CATHETERIZATION  06/16/2008   normal LV systolic function, MVP with some mild MR, prox LAD disease (50%) - Dr. Langston Reusing   CARDIAC CATHETERIZATION  10/16/2005   normal LV systolic function, MVP with trace to 1+ MR, mild nonobstructive CAD involving LAD 30% prox, 10-20 narrowing in prox LAD (Dr. Bishop Limbo)   CATARACT EXTRACTION, BILATERAL     CHOLECYSTECTOMY     NM MYOCAR PERF WALL MOTION  02/2011   lexiscan myoview; perfusion dfect of 5% of LV myocardium, small area of anteroseptal breast attenuation artifact, no reversible ischemia, EF 81%, abnormal but low risk scan   TONSILLECTOMY     Social History:   reports that she has never smoked. She has never used smokeless tobacco.  She reports that she does not drink alcohol and does not use drugs.  Family History  Problem Relation Age of Onset   Heart attack Mother 72   Pneumonia Father 28   Alzheimer's disease Father    Diabetes Sister 35   Cancer Sister    Dementia Sister 59   Pneumonia Sister        67 weeks old   Dementia Sister 33   Alzheimer's disease Sister 61   Heart Problems Brother 29    Medications: Patient's Medications  New Prescriptions   No medications on file  Previous Medications   ACETAMINOPHEN (TYLENOL) 325 MG TABLET    Take 325 mg by mouth as needed (back pain).   ALPRAZOLAM (XANAX) 0.5 MG TABLET    Take 1 tablet by mouth at bedtime.   AMOXICILLIN (AMOXIL) 500 MG CAPSULE    Take 4 capsules by mouth 1 hour prior to dental appointment every 4 months   ASPIRIN 81 MG TABLET    Take 81 mg by mouth daily.   CALCIUM CARBONATE-VITAMIN D 600-400 MG-UNIT TABLET    Take 1  tablet by mouth daily.   DONEPEZIL (ARICEPT) 10 MG TABLET    TAKE 1 TABLET BY MOUTH AT  BEDTIME   MEMANTINE (NAMENDA) 10 MG TABLET    TAKE 1 TABLET BY MOUTH  TWICE DAILY   METOPROLOL SUCCINATE (TOPROL-XL) 25 MG 24 HR TABLET    TAKE ONE-HALF TABLET BY  MOUTH DAILY   MULTIPLE VITAMINS-MINERALS (CENTRUM SILVER PO)    Take 1 tablet by mouth daily.    NUTRITIONAL SUPPLEMENTS (ENSURE HIGH PROTEIN) LIQD    Drink one can by mouth daily   PRAVASTATIN (PRAVACHOL) 40 MG TABLET    TAKE 1 TABLET BY MOUTH  DAILY   TRAZODONE (DESYREL) 50 MG TABLET    TAKE 0.5-1 TABLETS BY MOUTH AT BEDTIME AS NEEDED FOR SLEEP.  Modified Medications   No medications on file  Discontinued Medications   No medications on file    Physical Exam:  Vitals:   10/01/20 1448  BP: 124/68  Pulse: 64  Temp: (!) 97.2 F (36.2 C)  TempSrc: Temporal  SpO2: 99%  Weight: 114 lb (51.7 kg)  Height: 5\' 5"  (1.651 m)   Body mass index is 18.97 kg/m. Wt Readings from Last 3 Encounters:  10/01/20 114 lb (51.7 kg)  03/19/20 110 lb (49.9 kg)  09/14/19 118 lb (53.5 kg)    Physical Exam Constitutional:      General: She is not in acute distress.    Appearance: She is well-developed. She is not diaphoretic.  HENT:     Head: Normocephalic and atraumatic.     Mouth/Throat:     Pharynx: No oropharyngeal exudate.  Eyes:     Conjunctiva/sclera: Conjunctivae normal.     Pupils: Pupils are equal, round, and reactive to light.  Cardiovascular:     Rate and Rhythm: Normal rate and regular rhythm.     Heart sounds: Normal heart sounds.  Pulmonary:     Effort: Pulmonary effort is normal.     Breath sounds: Normal breath sounds.  Abdominal:     General: Bowel sounds are normal.     Palpations: Abdomen is soft.  Musculoskeletal:     Cervical back: Normal range of motion and neck supple.     Right lower leg: No edema.     Left lower leg: No edema.  Skin:    General: Skin is warm and dry.  Neurological:  Mental Status: She is  alert. Mental status is at baseline.  Psychiatric:        Mood and Affect: Mood normal.    Labs reviewed: Basic Metabolic Panel: Recent Labs    03/19/20 1538 07/12/20 1138  NA 143 139  K 4.1 3.7  CL 102 105  CO2 31 28  GLUCOSE 136 123*  BUN 34* 17  CREATININE 1.02* 0.96  CALCIUM 9.9 8.8*  TSH 2.82  --    Liver Function Tests: Recent Labs    03/19/20 1538  AST 22  ALT 19  BILITOT 0.4  PROT 7.0   No results for input(s): LIPASE, AMYLASE in the last 8760 hours. No results for input(s): AMMONIA in the last 8760 hours. CBC: Recent Labs    03/19/20 1538 07/12/20 1348  WBC 6.8 10.1  NEUTROABS 4,216 7.8*  HGB 14.2 14.2  HCT 42.3 43.0  MCV 94.4 96.4  PLT 242 183   Lipid Panel: Recent Labs    03/19/20 1538  CHOL 178  HDL 53  LDLCALC 99  TRIG 154*  CHOLHDL 3.4   TSH: Recent Labs    03/19/20 1538  TSH 2.82   A1C: No results found for: HGBA1C   Assessment/Plan 1. Need for influenza vaccination - Flu Vaccine QUAD High Dose(Fluad)  2. Essential hypertension Blood pressure well controlled Continue current medications Recheck metabolic panel with next follow up.  3. Senile purpura (HCC) Noted and stable.  4. Dementia with behavioral disturbance, unspecified dementia type (HCC) She is sleeping more but active when going to the day center. Continues on aricept and namenda. No acute changes in cognitive or functional status.  5. Osteopenia, unspecified location Continues cal and vit d, encourage weight bearing exercises.  6. Dyslipidemia Continues on pravastatin. Will check lipids with next follow up.   7. Insomnia, unspecified type Well controlled at this time. Continues on trazodone    Next appt: 6 months.  Janene Harvey. Biagio Borg  [facility] [phone]

## 2021-02-19 ENCOUNTER — Other Ambulatory Visit: Payer: Self-pay | Admitting: Nurse Practitioner

## 2021-02-19 DIAGNOSIS — G4700 Insomnia, unspecified: Secondary | ICD-10-CM

## 2021-02-19 NOTE — Telephone Encounter (Signed)
Patient has request refill on medication "Trazodone". Patient last refill dated 08/23/2020. Patient medication has warnings. Medication pend and sent to PCP Dewaine Oats Carlos American, NP for approval. Please Advise.

## 2021-03-04 ENCOUNTER — Ambulatory Visit: Payer: Medicare Other | Admitting: Nurse Practitioner

## 2021-04-01 ENCOUNTER — Other Ambulatory Visit: Payer: Self-pay

## 2021-04-01 ENCOUNTER — Encounter: Payer: Self-pay | Admitting: Nurse Practitioner

## 2021-04-01 ENCOUNTER — Ambulatory Visit (INDEPENDENT_AMBULATORY_CARE_PROVIDER_SITE_OTHER): Payer: Medicare Other | Admitting: Nurse Practitioner

## 2021-04-01 VITALS — BP 130/82 | HR 62 | Temp 97.5°F | Ht 65.0 in | Wt 110.0 lb

## 2021-04-01 DIAGNOSIS — D692 Other nonthrombocytopenic purpura: Secondary | ICD-10-CM

## 2021-04-01 DIAGNOSIS — E785 Hyperlipidemia, unspecified: Secondary | ICD-10-CM | POA: Diagnosis not present

## 2021-04-01 DIAGNOSIS — I2583 Coronary atherosclerosis due to lipid rich plaque: Secondary | ICD-10-CM

## 2021-04-01 DIAGNOSIS — M8580 Other specified disorders of bone density and structure, unspecified site: Secondary | ICD-10-CM

## 2021-04-01 DIAGNOSIS — G4700 Insomnia, unspecified: Secondary | ICD-10-CM | POA: Diagnosis not present

## 2021-04-01 DIAGNOSIS — R636 Underweight: Secondary | ICD-10-CM

## 2021-04-01 DIAGNOSIS — I2510 Atherosclerotic heart disease of native coronary artery without angina pectoris: Secondary | ICD-10-CM | POA: Diagnosis not present

## 2021-04-01 DIAGNOSIS — F03918 Unspecified dementia, unspecified severity, with other behavioral disturbance: Secondary | ICD-10-CM | POA: Diagnosis not present

## 2021-04-01 DIAGNOSIS — H6121 Impacted cerumen, right ear: Secondary | ICD-10-CM | POA: Diagnosis not present

## 2021-04-01 DIAGNOSIS — I10 Essential (primary) hypertension: Secondary | ICD-10-CM | POA: Diagnosis not present

## 2021-04-01 NOTE — Progress Notes (Signed)
Careteam: Patient Care Team: [name], NP as PCP - General (Geriatric Medicine)  PLACE OF SERVICE:  [facility] Directive information Does Patient Have a Medical Advance Directive?: Yes, Type of Advance Directive: Out of facility DNR (pink MOST or yellow form), Pre-existing out of facility DNR order (yellow form or pink MOST form): Yellow form placed in chart (order not valid for inpatient use);Pink MOST form placed in chart (order not valid for inpatient use), Does patient want to make changes to medical advance directive?: No - Patient declined  Allergies  Allergen Reactions   Corn-Containing Products    Omeprazole Other (See Comments)   Other Hives    Grapes and banana concentrate   Phenobarbital Rash    rash   Sulfa Antibiotics Rash    Chief Complaint  Patient presents with   Medical Management of Chronic Issues    5 month follow. Check ears, patient with decreased hearing. Patient is sleeping a lot. Skin concern, patient picks skin on leg. Here with daughter [name].      HPI: Patient is a 88 y.o. female for routine follow up.  She is not going to the adult day care due to progression of disease and she has been missing this.  Sleeping a lot more - has not needed trazodone. She recently been sleeping a lot more. Does not want to do any puzzles or books.  She will look at the newspaper and church booklets occasionally  Does not wish to go anywhere or do anything with daughter.  Looking into caregivers in the home.  She will eat 3 meals a day but decrease amount. Loves sweets. Finds foods in pockets.   She has amoxicillin prior to dentist but daughter does not know why she is taking it. No ortho surgeries or cardiac surgeries.   Review of Systems:  Review of Systems  Constitutional:  Negative for chills, fever and weight loss.  HENT:  Positive for hearing loss. Negative for tinnitus.   Respiratory:  Negative for cough, sputum production and shortness  of breath.   Cardiovascular:  Negative for chest pain, palpitations and leg swelling.  Gastrointestinal:  Negative for abdominal pain, constipation, diarrhea and heartburn.  Genitourinary:  Negative for dysuria, frequency and urgency.  Musculoskeletal:  Negative for back pain, falls, joint pain and myalgias.  Skin: Negative.   Neurological:  Negative for dizziness and headaches.  Psychiatric/Behavioral:  Positive for memory loss. Negative for depression. The patient does not have insomnia.    Past Medical History:  Diagnosis Date   Brittle nails    CAD (coronary artery disease)    mild by cath in 2010   Cellulitis    Cellulitis of left anterior lower leg    Chronic kidney disease, stage II (mild)    Dementia with behavioral disturbance    Depression with anxiety 05/25/2007   Dyslipidemia    GERD (gastroesophageal reflux disease)    Hallucinations    Head ache    Hordeolum externum of right eye    Hypertension    Leg ulcer, left, limited to breakdown of skin (HCC)    Mitral insufficiency    MVP (mitral valve prolapse)    Occult blood in stools    Open wound of lower leg, right, sequela    Open wound of lower leg, right, subsequent encounter    stage 3 ulcer   Osteopenia    Skin lesion of hand    Urinary frequency    Urticaria  Vertigo    Past Surgical History:  Procedure Laterality Date   APPENDECTOMY     CARDIAC CATHETERIZATION  06/16/2008   normal LV systolic function, MVP with some mild MR, prox LAD disease (50%) - Dr. Rockne Menghini   CARDIAC CATHETERIZATION  10/16/2005   normal LV systolic function, MVP with trace to 1+ MR, mild nonobstructive CAD involving LAD 30% prox, 10-20 narrowing in prox LAD (Dr. Corky Downs)   CATARACT EXTRACTION, BILATERAL     CHOLECYSTECTOMY     NM [place]  02/2011   lexiscan myoview; perfusion dfect of 5% of LV myocardium, small area of anteroseptal breast attenuation artifact, no reversible ischemia, EF 81%, abnormal but low risk  scan   TONSILLECTOMY     Social History:   reports that she has never smoked. She has never used smokeless tobacco. She reports that she does not drink alcohol and does not use drugs.  Family History  Problem Relation Age of Onset   Heart attack Mother 26   Pneumonia Father 60   Alzheimer's disease Father    Diabetes Sister 74   Cancer Sister    Dementia Sister 74   Pneumonia Sister        77 weeks old   Dementia Sister 12   Alzheimer's disease Sister 52   Heart Problems Brother 61    Medications: Patient's Medications  New Prescriptions   No medications on file  Previous Medications   ACETAMINOPHEN (TYLENOL) 325 MG TABLET    Take 325 mg by mouth as needed (back pain).   AMOXICILLIN (AMOXIL) 500 MG CAPSULE    Take 4 capsules by mouth 1 hour prior to dental appointment every 4 months   ASPIRIN 81 MG TABLET    Take 81 mg by mouth daily.   CALCIUM CARBONATE-VITAMIN D 600-400 MG-UNIT TABLET    Take 1 tablet by mouth daily.   DONEPEZIL (ARICEPT) 10 MG TABLET    TAKE 1 TABLET BY MOUTH AT  BEDTIME   MEMANTINE (NAMENDA) 10 MG TABLET    TAKE 1 TABLET BY MOUTH  TWICE DAILY   METOPROLOL SUCCINATE (TOPROL-XL) 25 MG 24 HR TABLET    TAKE ONE-HALF TABLET BY  MOUTH DAILY   MULTIPLE VITAMINS-MINERALS (CENTRUM SILVER PO)    Take 1 tablet by mouth daily.    NUTRITIONAL SUPPLEMENTS (ENSURE HIGH PROTEIN) LIQD    Drink one can by mouth daily   PRAVASTATIN (PRAVACHOL) 40 MG TABLET    TAKE 1 TABLET BY MOUTH  DAILY   TRAZODONE (DESYREL) 50 MG TABLET    TAKE 1/2 TO 1 TABLET BY MOUTH AT BEDTIME AS NEEDED FOR SLEEP  Modified Medications   No medications on file  Discontinued Medications   ALPRAZOLAM (XANAX) 0.5 MG TABLET    Take 1 tablet by mouth at bedtime.    Physical Exam:  Vitals:   04/01/21 1519  BP: 130/82  Pulse: 62  Temp: (!) 97.5 F (36.4 C)  TempSrc: Temporal  SpO2: 95%  Weight: 110 lb (49.9 kg)  Height: 5' 5"  (1.651 m)   Body mass index is 18.3 kg/m. Wt Readings from Last 3  Encounters:  04/01/21 110 lb (49.9 kg)  10/01/20 114 lb (51.7 kg)  03/19/20 110 lb (49.9 kg)    Physical Exam Constitutional:      General: She is not in acute distress.    Appearance: She is well-developed. She is not diaphoretic.  HENT:     Head: Normocephalic and atraumatic.  Right Ear: There is impacted cerumen.     Mouth/Throat:     Pharynx: No oropharyngeal exudate.  Eyes:     Conjunctiva/sclera: Conjunctivae normal.     Pupils: Pupils are equal, round, and reactive to light.  Cardiovascular:     Rate and Rhythm: Normal rate and regular rhythm.     Heart sounds: Normal heart sounds.  Pulmonary:     Effort: Pulmonary effort is normal.     Breath sounds: Normal breath sounds.  Abdominal:     General: Bowel sounds are normal.     Palpations: Abdomen is soft.  Musculoskeletal:     Cervical back: Normal range of motion and neck supple.     Right lower leg: No edema.     Left lower leg: No edema.  Skin:    General: Skin is warm and dry.  Neurological:     Mental Status: She is alert.  Psychiatric:        Mood and Affect: Mood normal.    Labs reviewed: Basic Metabolic Panel: Recent Labs    07/12/20 1138  NA 139  K 3.7  CL 105  CO2 28  GLUCOSE 123*  BUN 17  CREATININE 0.96  CALCIUM 8.8*   Liver Function Tests: No results for input(s): AST, ALT, ALKPHOS, BILITOT, PROT, ALBUMIN in the last 8760 hours. No results for input(s): LIPASE, AMYLASE in the last 8760 hours. No results for input(s): AMMONIA in the last 8760 hours. CBC: Recent Labs    07/12/20 1348  WBC 10.1  NEUTROABS 7.8*  HGB 14.2  HCT 43.0  MCV 96.4  PLT 183   Lipid Panel: No results for input(s): CHOL, HDL, LDLCALC, TRIG, CHOLHDL, LDLDIRECT in the last 8760 hours. TSH: No results for input(s): TSH in the last 8760 hours. A1C: No results found for: HGBA1C   Assessment/Plan 1. Essential hypertension -Blood pressure well controlled Continue current medications Recheck metabolic  panel - CMP with eGFR([facility]) - CBC with Differential/Platelet  2. Senile purpura ([place]) -noted, will monitor.  - CBC with Differential/Platelet  3. Osteopenia, unspecified location -Recommended to take calcium 600 mg twice daily with Vitamin D 2000 units daily and weight bearing activity 30 mins/5 days a week  4. Dyslipidemia -continues on statin - Lipid panel - CMP with eGFR([facility])  5. Insomnia, unspecified type Not having an issue at this time.   6. Underweight -continue to encourage 3 meals a day with protein supplement. - Ambulatory referral to Hospice  7. Coronary artery disease due to lipid rich plaque No chest pains noted. Continues on asa.  - Ambulatory referral to Hospice  8. Dementia with behavioral disturbance Stable, no acute changes in cognitive or functional status but overall decline noted slowly, needing increase support and direction.  continue supportive care.  - Ambulatory referral to Hospice  9. Impacted cerumen of right ear Removed with lavage and curette, pt tolerated well.   Return in about 4 months (around 08/01/2021) for routine follow up .: Florette Thai K. Cowley, [facility] [phone]

## 2021-04-02 ENCOUNTER — Other Ambulatory Visit: Payer: Self-pay | Admitting: Nurse Practitioner

## 2021-04-02 DIAGNOSIS — E876 Hypokalemia: Secondary | ICD-10-CM

## 2021-04-02 DIAGNOSIS — F03918 Unspecified dementia, unspecified severity, with other behavioral disturbance: Secondary | ICD-10-CM

## 2021-04-02 LAB — LIPID PANEL
Cholesterol: 236 mg/dL — ABNORMAL HIGH (ref <200)
HDL: 59 mg/dL (ref >=50)
LDL Cholesterol (Calc): 146 mg/dL (calc) — ABNORMAL HIGH
Non-HDL Cholesterol (Calc): 177 mg/dL (calc) — ABNORMAL HIGH (ref <130)
Total CHOL/HDL Ratio: 4 (calc) (ref <5.0)
Triglycerides: 179 mg/dL — ABNORMAL HIGH (ref <150)

## 2021-04-02 LAB — COMPLETE METABOLIC PANEL WITH GFR
AG Ratio: 1.3 (calc) (ref 1.0–2.5)
ALT: 9 U/L (ref 6–29)
AST: 17 U/L (ref 10–35)
Albumin: 4 g/dL (ref 3.6–5.1)
Alkaline phosphatase (APISO): 71 U/L (ref 37–153)
BUN: 18 mg/dL (ref 7–25)
CO2: 36 mmol/L — ABNORMAL HIGH (ref 20–32)
Calcium: 9.9 mg/dL (ref 8.6–10.4)
Chloride: 102 mmol/L (ref 98–110)
Creat: 0.83 mg/dL (ref 0.60–0.95)
Globulin: 3.2 g/dL (calc) (ref 1.9–3.7)
Glucose, Bld: 130 mg/dL (ref 65–139)
Potassium: 3.2 mmol/L — ABNORMAL LOW (ref 3.5–5.3)
Sodium: 147 mmol/L — ABNORMAL HIGH (ref 135–146)
Total Bilirubin: 0.4 mg/dL (ref 0.2–1.2)
Total Protein: 7.2 g/dL (ref 6.1–8.1)
eGFR: 66 mL/min/{1.73_m2} (ref >=60)

## 2021-04-02 LAB — CBC WITH DIFFERENTIAL/PLATELET
Absolute Monocytes: 392 cells/uL (ref 200–950)
Basophils Absolute: 21 cells/uL (ref 0–200)
Basophils Relative: 0.3 %
Eosinophils Absolute: 63 cells/uL (ref 15–500)
Eosinophils Relative: 0.9 %
HCT: 43.6 % (ref 35.0–45.0)
Hemoglobin: 14.8 g/dL (ref 11.7–15.5)
Lymphs Abs: 2226 cells/uL (ref 850–3900)
MCH: 31.5 pg (ref 27.0–33.0)
MCHC: 33.9 g/dL (ref 32.0–36.0)
MCV: 92.8 fL (ref 80.0–100.0)
MPV: 9.8 fL (ref 7.5–12.5)
Monocytes Relative: 5.6 %
Neutro Abs: 4298 cells/uL (ref 1500–7800)
Neutrophils Relative %: 61.4 %
Platelets: 214 10*3/uL (ref 140–400)
RBC: 4.7 10*6/uL (ref 3.80–5.10)
RDW: 12.3 % (ref 11.0–15.0)
Total Lymphocyte: 31.8 %
WBC: 7 10*3/uL (ref 3.8–10.8)

## 2021-04-02 MED ORDER — POTASSIUM CHLORIDE CRYS ER 20 MEQ PO TBCR: 40.0000 meq | EXTENDED_RELEASE_TABLET | Freq: Every day | ORAL | 0 refills | Status: DC

## 2021-04-03 NOTE — Telephone Encounter (Signed)
Patient has request refill on medications Memantine, Metoprolol, and Pravastatin. Patient last refills on all medications are 05/01/2020. All patient medications have Allergy Contraindications. Medications pend and sent to PCP Sharon Seller, NP . ?

## 2021-04-09 ENCOUNTER — Other Ambulatory Visit: Payer: Self-pay

## 2021-04-09 ENCOUNTER — Other Ambulatory Visit: Payer: Medicare Other

## 2021-04-09 DIAGNOSIS — E876 Hypokalemia: Secondary | ICD-10-CM

## 2021-04-10 LAB — BASIC METABOLIC PANEL WITH GFR
BUN: 24 mg/dL (ref 7–25)
CO2: 26 mmol/L (ref 20–32)
Calcium: 9.1 mg/dL (ref 8.6–10.4)
Chloride: 104 mmol/L (ref 98–110)
Creat: 0.72 mg/dL (ref 0.60–0.95)
Glucose, Bld: 93 mg/dL (ref 65–139)
Potassium: 3.2 mmol/L — ABNORMAL LOW (ref 3.5–5.3)
Sodium: 146 mmol/L (ref 135–146)
eGFR: 78 mL/min/{1.73_m2} (ref >=60)

## 2021-04-11 ENCOUNTER — Other Ambulatory Visit: Payer: Self-pay

## 2021-04-11 ENCOUNTER — Other Ambulatory Visit: Payer: Self-pay | Admitting: Nurse Practitioner

## 2021-04-11 DIAGNOSIS — E876 Hypokalemia: Secondary | ICD-10-CM

## 2021-04-11 MED ORDER — POTASSIUM CHLORIDE CRYS ER 10 MEQ PO TBCR: 10.0000 meq | EXTENDED_RELEASE_TABLET | Freq: Every day | ORAL | 1 refills | Status: DC

## 2021-04-15 ENCOUNTER — Telehealth: Payer: Self-pay

## 2021-04-15 NOTE — Telephone Encounter (Signed)
I called and left message for daughter to call and reschedule September appointment due to Jessica's schedule change ?

## 2021-04-16 ENCOUNTER — Other Ambulatory Visit: Payer: Medicare Other

## 2021-04-19 ENCOUNTER — Other Ambulatory Visit: Payer: Self-pay | Admitting: Nurse Practitioner

## 2021-04-19 DIAGNOSIS — F03918 Unspecified dementia, unspecified severity, with other behavioral disturbance: Secondary | ICD-10-CM

## 2021-04-19 NOTE — Telephone Encounter (Signed)
Patient has request refill on medication "Donepezil". Patient last refill dated 05/01/2020. Patient medication has Allergy Warnings. Medication pend and sent to PCP Janyth Contes Janene Harvey, NP for approval.  ?

## 2021-04-22 ENCOUNTER — Other Ambulatory Visit: Payer: Medicare Other

## 2021-04-22 DIAGNOSIS — E876 Hypokalemia: Secondary | ICD-10-CM

## 2021-05-04 ENCOUNTER — Other Ambulatory Visit: Payer: Self-pay | Admitting: Nurse Practitioner

## 2021-05-21 ENCOUNTER — Telehealth: Payer: Self-pay

## 2021-05-21 ENCOUNTER — Ambulatory Visit (INDEPENDENT_AMBULATORY_CARE_PROVIDER_SITE_OTHER): Payer: Medicare Other | Admitting: Nurse Practitioner

## 2021-05-21 DIAGNOSIS — Z515 Encounter for palliative care: Secondary | ICD-10-CM

## 2021-05-21 DIAGNOSIS — F03918 Unspecified dementia, unspecified severity, with other behavioral disturbance: Secondary | ICD-10-CM | POA: Diagnosis not present

## 2021-05-21 NOTE — Progress Notes (Signed)
? ? ?Careteam: ?Patient Care Team: ?[name], NP as PCP - General (Geriatric Medicine) ? ?Advanced Directive information ?  ? ?Allergies  ?Allergen Reactions  ? Corn-Containing Products   ? Omeprazole Other (See Comments)  ? Other Hives  ?  Grapes and banana concentrate  ? Phenobarbital Rash  ?  rash  ? Sulfa Antibiotics Rash  ? ? ?Chief Complaint  ?Patient presents with  ? Acute Visit  ?  Daughter would like to discuss hospice care. Patient sleeping more. Patient wasn't able to walk on Sunday. Patient wasn't eating or drinking. Patient had low blood pressure. Patient has home health  ? ? ? ?HPI: Patient is a 88 y.o. female via telephone.  ?She was referred to hospice services at the last visit but daughter reports patient did not want anyone coming into the home. Ms Marcano started to let her daughter bath her but then she worsened.  ?Sleeping more, decrease in eating and drinking. She decided to call hospice back and they came in for evaluation. She was talking with her boss and then thought she maybe called the wrong hospice.  ?She called the hospice that was referred from our office.  ? ?Daughter now meeting the hospice team for progression and advance care planning.  ?She is eating minimally and drinking minimally now where she was a good eater.  ?Goal is to keep her comfortable at home.  ?her medication has been stopped by hospice. Blood pressure was low when checked by the hospice nurse.  ? ?Review of Systems:  ?Review of Systems  ?Unable to perform ROS: Dementia  ? ?Past Medical History:  ?Diagnosis Date  ? Brittle nails   ? CAD (coronary artery disease)   ? mild by cath in 2010  ? Cellulitis   ? Cellulitis of left anterior lower leg   ? Chronic kidney disease, stage II (mild)   ? Dementia with behavioral disturbance   ? Depression with anxiety 05/25/2007  ? Dyslipidemia   ? GERD (gastroesophageal reflux disease)   ? Hallucinations   ? Head ache   ? Hordeolum externum of right eye   ? Hypertension    ? Leg ulcer, left, limited to breakdown of skin ([place])   ? Mitral insufficiency   ? MVP (mitral valve prolapse)   ? Occult blood in stools   ? Open wound of lower leg, right, sequela   ? Open wound of lower leg, right, subsequent encounter   ? stage 3 ulcer  ? Osteopenia   ? Skin lesion of hand   ? Urinary frequency   ? Urticaria   ? Vertigo   ? ?Past Surgical History:  ?Procedure Laterality Date  ? APPENDECTOMY    ? CARDIAC CATHETERIZATION  06/16/2008  ? normal LV systolic function, MVP with some mild MR, prox LAD disease (50%) - Dr. Loni Muse. Little  ? CARDIAC CATHETERIZATION  10/16/2005  ? normal LV systolic function, MVP with trace to 1+ MR, mild nonobstructive CAD involving LAD 30% prox, 10-20 narrowing in prox LAD (Dr. Corky Downs)  ? CATARACT EXTRACTION, BILATERAL    ? CHOLECYSTECTOMY    ? NM MYOCAR PERF WALL MOTION  02/2011  ? lexiscan myoview; perfusion dfect of 5% of LV myocardium, small area of anteroseptal breast attenuation artifact, no reversible ischemia, EF 81%, abnormal but low risk scan  ? TONSILLECTOMY    ? ?Social History: ?  reports that she has never smoked. She has never used smokeless tobacco. She reports that she does  not drink alcohol and does not use drugs. ? ?Family History  ?Problem Relation Age of Onset  ? Heart attack Mother 32  ? Pneumonia Father 67  ? Alzheimer's disease Father   ? Diabetes Sister 41  ? Cancer Sister   ? Dementia Sister 32  ? Pneumonia Sister   ?     35 weeks old  ? Dementia Sister 55  ? Alzheimer's disease Sister 80  ? Heart Problems Brother 27  ? ? ?Medications: ?Patient's Medications  ?New Prescriptions  ? No medications on file  ?Previous Medications  ? ACETAMINOPHEN (TYLENOL) 325 MG TABLET    Take 325 mg by mouth as needed (back pain).  ? ASPIRIN 81 MG TABLET    Take 81 mg by mouth daily.  ? CALCIUM CARBONATE-VITAMIN D 600-400 MG-UNIT TABLET    Take 1 tablet by mouth daily.  ? DONEPEZIL (ARICEPT) 10 MG TABLET    TAKE 1 TABLET BY MOUTH AT  BEDTIME  ? KLOR-CON M10 10 MEQ  TABLET    TAKE 1 TABLET BY MOUTH EVERY DAY  ? MEMANTINE (NAMENDA) 10 MG TABLET    TAKE 1 TABLET BY MOUTH  TWICE DAILY  ? METOPROLOL SUCCINATE (TOPROL-XL) 25 MG 24 HR TABLET    TAKE ONE-HALF TABLET BY  MOUTH DAILY  ? MULTIPLE VITAMINS-MINERALS (CENTRUM SILVER PO)    Take 1 tablet by mouth daily.   ? NUTRITIONAL SUPPLEMENTS (ENSURE HIGH PROTEIN) LIQD    Drink one can by mouth daily  ? PRAVASTATIN (PRAVACHOL) 40 MG TABLET    TAKE 1 TABLET BY MOUTH  DAILY  ? TRAZODONE (DESYREL) 50 MG TABLET    TAKE 1/2 TO 1 TABLET BY MOUTH AT BEDTIME AS NEEDED FOR SLEEP  ?Modified Medications  ? No medications on file  ?Discontinued Medications  ? No medications on file  ? ? ?Physical Exam: ? ?There were no vitals filed for this visit. ?There is no height or weight on file to calculate BMI. ?Wt Readings from Last 3 Encounters:  ?04/01/21 110 lb (49.9 kg)  ?10/01/20 114 lb (51.7 kg)  ?03/19/20 110 lb (49.9 kg)  ? ? ? ? ?Labs reviewed: ?Basic Metabolic Panel: ?Recent Labs  ?  07/12/20 ?1138 04/01/21 ?1559 04/09/21 ?1517  ?NA 139 147* 146  ?K 3.7 3.2* 3.2*  ?CL 105 102 104  ?CO2 28 36* 26  ?GLUCOSE 123* 130 93  ?BUN 17 18 24   ?CREATININE 0.96 0.83 0.72  ?CALCIUM 8.8* 9.9 9.1  ? ?Liver Function Tests: ?Recent Labs  ?  04/01/21 ?1559  ?AST 17  ?ALT 9  ?BILITOT 0.4  ?PROT 7.2  ? ?No results for input(s): LIPASE, AMYLASE in the last 8760 hours. ?No results for input(s): AMMONIA in the last 8760 hours. ?CBC: ?Recent Labs  ?  07/12/20 ?1348 04/01/21 ?1559  ?WBC 10.1 7.0  ?NEUTROABS 7.8* 4,298  ?HGB 14.2 14.8  ?HCT 43.0 43.6  ?MCV 96.4 92.8  ?PLT 183 214  ? ?Lipid Panel: ?Recent Labs  ?  04/01/21 ?1559  ?CHOL 236*  ?HDL 59  ?LDLCALC 146*  ?TRIG 179*  ?CHOLHDL 4.0  ? ?TSH: ?No results for input(s): TSH in the last 8760 hours. ?A1C: ?No results found for: HGBA1C ? ? ?Assessment/Plan ?1. Hospice care ?Progressive decline, now not eating and drinking. Followed by hospice [facility] ? ?2. Dementia with behavioral disturbance ([place]) ?Ongoing decline,  comfort care/hospice care following. Care by daughter.  ? ? ?Carlos American. Dewaine Oats, AGNP ? ?[facility] ?[phone]  ? ? ?  Virtual Visit via telephone ? ?I connected with patient on 05/21/21 at  1:20 PM EDT by telephone and verified that I am speaking with the correct person using two identifiers. ? ?Location: ?Patient: home ?Provider: [facility]  ?  ?I discussed the limitations, risks, security and privacy concerns of performing an evaluation and management service by telephone and the availability of in person appointments. I also discussed with the patient that there may be a patient responsible charge related to this service. The patient expressed understanding and agreed to proceed. ? ? ?I discussed the assessment and treatment plan with the patient. The patient was provided an opportunity to ask questions and all were answered. The patient agreed with the plan and demonstrated an understanding of the instructions. ?  ?The patient was advised to call back or seek an in-person evaluation if the symptoms worsen or if the condition fails to improve as anticipated. ? ?I provided 16 minutes of non-face-to-face time during this encounter. ? ?Carlos American. Dewaine Oats, AGNP ?Avs printed and mailed  ?

## 2021-05-21 NOTE — Progress Notes (Signed)
This service is provided via telemedicine ? ?No vital signs collected/recorded due to the encounter was a telemedicine visit.  ? ?Location of patient (ex: home, work):  Home ? ?Patient consents to a telephone visit:  Yes, see encounter dated 05/21/2021 ? ?Location of the provider (ex: office, home):  [employer] ? ?Name of any referring provider:  N/A ? ?Names of all persons participating in the telemedicine service and their role in the encounter:  Abbey Chatters, Nurse Practitioner, Elveria Royals, CMA, and patient.  ? ?Time spent on call:  9 minutes with medical assistant ? ?

## 2021-05-21 NOTE — Telephone Encounter (Signed)
Ms. myrissa, chipley are scheduled for a virtual visit with your provider today.   ? ?Just as we do with appointments in the office, we must obtain your consent to participate.  Your consent will be active for this visit and any virtual visit you may have with one of our providers in the next 365 days.   ? ?If you have a MyChart account, I can also send a copy of this consent to you electronically.  All virtual visits are billed to your insurance company just like a traditional visit in the office.  As this is a virtual visit, video technology does not allow for your provider to perform a traditional examination.  This may limit your provider's ability to fully assess your condition.  If your provider identifies any concerns that need to be evaluated in person or the need to arrange testing such as labs, EKG, etc, we will make arrangements to do so.   ? ?Although advances in technology are sophisticated, we cannot ensure that it will always work on either your end or our end.  If the connection with a video visit is poor, we may have to switch to a telephone visit.  With either a video or telephone visit, we are not always able to ensure that we have a secure connection.   I need to obtain your verbal consent now.   Are you willing to proceed with your visit today?  ? ?DEKAYLA PRESTRIDGE has provided verbal consent on 05/21/2021 for a virtual visit (video or telephone). ? ? ?Elveria Royals, CMA ?05/21/2021  1:32 PM ?  ?

## 2021-06-02 ENCOUNTER — Other Ambulatory Visit: Payer: Self-pay | Admitting: Nurse Practitioner

## 2021-08-05 ENCOUNTER — Ambulatory Visit: Payer: Medicare Other | Admitting: Nurse Practitioner

## 2021-08-20 DEATH — deceased

## 2021-09-24 ENCOUNTER — Encounter: Payer: Medicare Other | Admitting: Nurse Practitioner
